# Patient Record
Sex: Male | Born: 1940 | Race: White | Hispanic: No | Marital: Married | Smoking: Never smoker
Health system: Southern US, Community
[De-identification: ages and names within clinical notes are randomized; demographics above are authoritative.]

## PROBLEM LIST (undated history)

## (undated) DIAGNOSIS — R479 Unspecified speech disturbances: Secondary | ICD-10-CM

## (undated) DIAGNOSIS — F0280 Dementia in other diseases classified elsewhere without behavioral disturbance: Secondary | ICD-10-CM

## (undated) DIAGNOSIS — R269 Unspecified abnormalities of gait and mobility: Secondary | ICD-10-CM

## (undated) DIAGNOSIS — E119 Type 2 diabetes mellitus without complications: Secondary | ICD-10-CM

## (undated) DIAGNOSIS — F419 Anxiety disorder, unspecified: Secondary | ICD-10-CM

## (undated) DIAGNOSIS — F32A Depression, unspecified: Secondary | ICD-10-CM

## (undated) DIAGNOSIS — E785 Hyperlipidemia, unspecified: Secondary | ICD-10-CM

## (undated) DIAGNOSIS — G3183 Dementia with Lewy bodies: Secondary | ICD-10-CM

## (undated) DIAGNOSIS — I10 Essential (primary) hypertension: Secondary | ICD-10-CM

## (undated) DIAGNOSIS — F329 Major depressive disorder, single episode, unspecified: Secondary | ICD-10-CM

## (undated) HISTORY — DX: Anxiety disorder, unspecified: F41.9

## (undated) HISTORY — DX: Depression, unspecified: F32.A

## (undated) HISTORY — DX: Hyperlipidemia, unspecified: E78.5

## (undated) HISTORY — DX: Essential (primary) hypertension: I10

## (undated) HISTORY — DX: Type 2 diabetes mellitus without complications: E11.9

## (undated) HISTORY — DX: Unspecified speech disturbances: R47.9

## (undated) HISTORY — DX: Major depressive disorder, single episode, unspecified: F32.9

## (undated) HISTORY — DX: Unspecified abnormalities of gait and mobility: R26.9

## (undated) HISTORY — PX: OTHER SURGICAL HISTORY: SHX169

---

## 2001-11-01 ENCOUNTER — Encounter: Admission: RE | Admit: 2001-11-01 | Discharge: 2001-11-01 | Payer: Self-pay | Admitting: Family Medicine

## 2001-11-01 ENCOUNTER — Encounter: Payer: Self-pay | Admitting: Family Medicine

## 2001-12-31 ENCOUNTER — Encounter: Payer: Self-pay | Admitting: *Deleted

## 2001-12-31 ENCOUNTER — Inpatient Hospital Stay (HOSPITAL_COMMUNITY): Admission: AD | Admit: 2001-12-31 | Discharge: 2002-01-05 | Payer: Self-pay | Admitting: *Deleted

## 2002-05-12 ENCOUNTER — Ambulatory Visit (HOSPITAL_COMMUNITY): Admission: RE | Admit: 2002-05-12 | Discharge: 2002-05-12 | Payer: Self-pay | Admitting: *Deleted

## 2003-04-08 ENCOUNTER — Emergency Department (HOSPITAL_COMMUNITY): Admission: EM | Admit: 2003-04-08 | Discharge: 2003-04-08 | Payer: Self-pay | Admitting: Emergency Medicine

## 2003-04-08 ENCOUNTER — Encounter: Payer: Self-pay | Admitting: Emergency Medicine

## 2003-08-09 ENCOUNTER — Ambulatory Visit (HOSPITAL_COMMUNITY): Admission: RE | Admit: 2003-08-09 | Discharge: 2003-08-09 | Payer: Self-pay | Admitting: *Deleted

## 2006-12-08 ENCOUNTER — Encounter: Admission: RE | Admit: 2006-12-08 | Discharge: 2006-12-08 | Payer: Self-pay | Admitting: Family Medicine

## 2006-12-27 ENCOUNTER — Emergency Department (HOSPITAL_COMMUNITY): Admission: EM | Admit: 2006-12-27 | Discharge: 2006-12-27 | Payer: Self-pay | Admitting: Emergency Medicine

## 2011-08-08 ENCOUNTER — Other Ambulatory Visit: Payer: Self-pay | Admitting: Neurology

## 2011-08-08 DIAGNOSIS — R269 Unspecified abnormalities of gait and mobility: Secondary | ICD-10-CM

## 2011-08-08 DIAGNOSIS — G609 Hereditary and idiopathic neuropathy, unspecified: Secondary | ICD-10-CM

## 2011-08-15 ENCOUNTER — Ambulatory Visit
Admission: RE | Admit: 2011-08-15 | Discharge: 2011-08-15 | Disposition: A | Discharge status: Home / Self Care | Payer: Medicare Other | Source: Ambulatory Visit | Attending: Neurology | Admitting: Neurology

## 2011-08-15 DIAGNOSIS — G609 Hereditary and idiopathic neuropathy, unspecified: Secondary | ICD-10-CM

## 2011-08-15 DIAGNOSIS — R269 Unspecified abnormalities of gait and mobility: Secondary | ICD-10-CM

## 2011-12-30 ENCOUNTER — Ambulatory Visit: Payer: Medicare Other

## 2012-01-06 ENCOUNTER — Ambulatory Visit: Payer: Medicare Other | Attending: Family Medicine

## 2012-01-06 DIAGNOSIS — R262 Difficulty in walking, not elsewhere classified: Secondary | ICD-10-CM | POA: Insufficient documentation

## 2012-01-06 DIAGNOSIS — IMO0001 Reserved for inherently not codable concepts without codable children: Secondary | ICD-10-CM | POA: Insufficient documentation

## 2012-01-06 DIAGNOSIS — M6281 Muscle weakness (generalized): Secondary | ICD-10-CM | POA: Insufficient documentation

## 2012-01-06 DIAGNOSIS — R269 Unspecified abnormalities of gait and mobility: Secondary | ICD-10-CM | POA: Insufficient documentation

## 2012-01-13 ENCOUNTER — Ambulatory Visit: Payer: Medicare Other | Admitting: Physical Therapy

## 2012-01-22 ENCOUNTER — Ambulatory Visit: Payer: Medicare Other

## 2012-01-27 ENCOUNTER — Ambulatory Visit: Payer: Medicare Other

## 2012-02-03 ENCOUNTER — Ambulatory Visit: Payer: Medicare Other | Attending: Family Medicine | Admitting: Physical Therapy

## 2012-02-03 DIAGNOSIS — IMO0001 Reserved for inherently not codable concepts without codable children: Secondary | ICD-10-CM | POA: Insufficient documentation

## 2012-02-03 DIAGNOSIS — R269 Unspecified abnormalities of gait and mobility: Secondary | ICD-10-CM | POA: Insufficient documentation

## 2012-02-03 DIAGNOSIS — R262 Difficulty in walking, not elsewhere classified: Secondary | ICD-10-CM | POA: Insufficient documentation

## 2012-02-03 DIAGNOSIS — M6281 Muscle weakness (generalized): Secondary | ICD-10-CM | POA: Insufficient documentation

## 2013-08-09 ENCOUNTER — Other Ambulatory Visit (HOSPITAL_COMMUNITY): Payer: Self-pay | Admitting: Family Medicine

## 2013-08-09 ENCOUNTER — Encounter (HOSPITAL_COMMUNITY): Payer: Self-pay | Admitting: Family Medicine

## 2013-08-09 ENCOUNTER — Other Ambulatory Visit (HOSPITAL_COMMUNITY): Payer: Medicare Other

## 2013-08-09 DIAGNOSIS — I359 Nonrheumatic aortic valve disorder, unspecified: Secondary | ICD-10-CM

## 2013-08-12 ENCOUNTER — Encounter (HOSPITAL_COMMUNITY): Payer: Self-pay | Admitting: Family Medicine

## 2013-08-30 ENCOUNTER — Ambulatory Visit (HOSPITAL_COMMUNITY): Payer: Medicare Other | Attending: Cardiology | Admitting: Cardiology

## 2013-08-30 ENCOUNTER — Encounter: Payer: Self-pay | Admitting: Cardiology

## 2013-08-30 ENCOUNTER — Encounter (INDEPENDENT_AMBULATORY_CARE_PROVIDER_SITE_OTHER): Payer: Self-pay

## 2013-08-30 DIAGNOSIS — I059 Rheumatic mitral valve disease, unspecified: Secondary | ICD-10-CM | POA: Insufficient documentation

## 2013-08-30 DIAGNOSIS — I359 Nonrheumatic aortic valve disorder, unspecified: Secondary | ICD-10-CM

## 2013-08-30 NOTE — Progress Notes (Signed)
Echo performed. 

## 2013-10-15 ENCOUNTER — Ambulatory Visit (INDEPENDENT_AMBULATORY_CARE_PROVIDER_SITE_OTHER): Payer: Medicare Other | Admitting: Family Medicine

## 2013-10-15 VITALS — BP 134/80 | HR 76 | Temp 98.1°F | Resp 18 | Ht 68.0 in | Wt 174.8 lb

## 2013-10-15 DIAGNOSIS — R05 Cough: Secondary | ICD-10-CM

## 2013-10-15 DIAGNOSIS — R059 Cough, unspecified: Secondary | ICD-10-CM

## 2013-10-15 MED ORDER — BENZONATATE 100 MG PO CAPS: 100.0000 mg | ORAL_CAPSULE | Freq: Three times a day (TID) | ORAL | Status: DC | PRN

## 2013-10-15 MED ORDER — DOXYCYCLINE HYCLATE 100 MG PO TABS: 100.0000 mg | ORAL_TABLET | Freq: Two times a day (BID) | ORAL | Status: DC

## 2013-10-15 NOTE — Progress Notes (Signed)
Subjective:    Patient ID: Raymond Ferrell, male    DOB: [birth year], 73 y.o.   MRN: [id number]  HPI This chart was scribed for Meredith Staggers, MD by Andrew Au, ED Scribe. This patient was seen in room 14 and the patient's care was started at 10:13 AM.  HPI Comments: Raymond Ferrell is a 73 y.o. male who presents to the [facility] complaining of unproductive dry cough and nasal congestion onset one week. Pt states his symptoms started in his chest. He reports that he has only had congestion for about 2 day.  He has been taking Mucinex with minor relief. Pt reports he is not a smoker but occasionally drinks. Pt reports recent sick contacts. He denies fever. He also denies asthma or h/o heart surgery. Pt reports that he has gotten a flu shot this year.  Pt has been seeing Dr. Massie Maroon at [facility].   echo 08/30/13 - EF 55-60% and mild thickening and calcification of his aortic valve, no stenosis.   There are no active problems to display for this patient.  Past Medical History  Diagnosis Date  . Depression   . Diabetes mellitus without complication   . Anxiety   . Hypertension   . Hyperlipidemia    History reviewed. No pertinent past surgical history. No Known Allergies Prior to Admission medications   Medication Sig Start Date End Date Taking? Authorizing Provider  amLODipine (NORVASC) 5 MG tablet Take 5 mg by mouth daily.   Yes Historical Provider, MD  atorvastatin (LIPITOR) 10 MG tablet Take 10 mg by mouth daily.   Yes Historical Provider, MD  FLUoxetine (PROZAC) 20 MG tablet Take 20 mg by mouth daily.   Yes Historical Provider, MD  labetalol (NORMODYNE) 200 MG tablet Take 200 mg by mouth 2 (two) times daily.   Yes Historical Provider, MD  lisinopril (PRINIVIL,ZESTRIL) 20 MG tablet Take 20 mg by mouth daily.   Yes Historical Provider, MD  lisinopril-hydrochlorothiazide (PRINZIDE,ZESTORETIC) 20-25 MG per tablet Take 1 tablet by mouth daily.   Yes Historical  Provider, MD  metFORMIN (GLUCOPHAGE) 500 MG tablet Take by mouth 2 (two) times daily with a meal.   Yes Historical Provider, MD   History   Social History  . Marital Status: Married    Spouse Name: N/A    Number of Children: N/A  . Years of Education: N/A   Occupational History  . Not on file.   Social History Main Topics  . Smoking status: Never Smoker   . Smokeless tobacco: Not on file  . Alcohol Use: Yes  . Drug Use: Not on file  . Sexual Activity: Not on file   Other Topics Concern  . Not on file   Social History Narrative  . No narrative on file     Review of Systems  Constitutional: Negative for fever and chills.  HENT: Positive for congestion.   Respiratory: Positive for cough. Negative for shortness of breath.        Objective:   Physical Exam  Vitals reviewed. Constitutional: He is oriented to person, place, and time. He appears well-developed and well-nourished.  HENT:  Head: Normocephalic and atraumatic.  Right Ear: Tympanic membrane, external ear and ear canal normal.  Left Ear: Tympanic membrane, external ear and ear canal normal.  Nose: No rhinorrhea.  Mouth/Throat: Oropharynx is clear and moist and mucous membranes are normal. No oropharyngeal exudate or posterior oropharyngeal erythema.  Eyes: Conjunctivae are normal. Pupils are  equal, round, and reactive to light.  Neck: Neck supple.  Cardiovascular: Normal rate, regular rhythm and intact distal pulses.   Murmur heard.  Systolic murmur is present with a grade of 2/6  2/6 murmur in left chest wall, left sternal bordel   Pulmonary/Chest: Effort normal and breath sounds normal. No respiratory distress. He has no wheezes. He has no rhonchi. He has no rales.  Abdominal: Soft. There is no tenderness.  Lymphadenopathy:    He has no cervical adenopathy.  Neurological: He is alert and oriented to person, place, and time.  Skin: Skin is warm and dry. No rash noted.  Psychiatric: He has a normal mood  and affect. His behavior is normal.   Filed Vitals:   10/15/13 0928  BP: 134/80  Pulse: 76  Temp: 98.1 F (36.7 C)  TempSrc: Oral  Resp: 18  Height: 5\' 8"  (1.727 m)  Weight: 174 lb 12.8 oz (79.289 kg)  SpO2: 98%      Assessment & Plan:   Raymond Ferrell is a 73 y.o. male Cough - Plan: benzonatate (TESSALON) 100 MG capsule, doxycycline (VIBRA-TABS) 100 MG tablet  1 week hx of cough, suspected viral illness, reassuring exam and vitals signs stable, heart murmur, but recent echo noted and told he has had this a long time. No rales on exam. Cont mucinex, sx care, tessalon prn, and if not improving in next few days, can start doxycycline, but discussed with pt and dtr rtc precautions as below. Understanding expressed.    Meds ordered this encounter  Medications  . amLODipine (NORVASC) 5 MG tablet    Sig: Take 5 mg by mouth daily.  Marland Kitchen. labetalol (NORMODYNE) 200 MG tablet    Sig: Take 200 mg by mouth 2 (two) times daily.  . metFORMIN (GLUCOPHAGE) 500 MG tablet    Sig: Take by mouth 2 (two) times daily with a meal.  . FLUoxetine (PROZAC) 20 MG tablet    Sig: Take 20 mg by mouth daily.  Marland Kitchen. lisinopril (PRINIVIL,ZESTRIL) 20 MG tablet    Sig: Take 20 mg by mouth daily.  Marland Kitchen. lisinopril-hydrochlorothiazide (PRINZIDE,ZESTORETIC) 20-25 MG per tablet    Sig: Take 1 tablet by mouth daily.  Marland Kitchen. atorvastatin (LIPITOR) 10 MG tablet    Sig: Take 10 mg by mouth daily.  . benzonatate (TESSALON) 100 MG capsule    Sig: Take 1 capsule (100 mg total) by mouth 3 (three) times daily as needed for cough.    Dispense:  20 capsule    Refill:  0  . doxycycline (VIBRA-TABS) 100 MG tablet    Sig: Take 1 tablet (100 mg total) by mouth 2 (two) times daily.    Dispense:  20 tablet    Refill:  0   Patient Instructions  Your lungs sound clear this morning. I do not hear a pneumonia, and your temperature and oxygen tests are normal.  I suspect you had a virus, and cough can last a week or two with this. Ok to  continue mucinex for cough, drink plenty of fluids. I prescribed a medicine called tessalon if you need something to stop the cough - especially to sleep. If not improving next week - can fill antibiotic for possible bronchitis, but return to the clinic or go to the nearest emergency room if any of your symptoms worsen or new symptoms occur - especially if any shortness of breath, fever, or other worsening.   Cough, Adult  A cough is a reflex that helps clear your  throat and airways. It can help heal the body or may be a reaction to an irritated airway. A cough may only last 2 or 3 weeks (acute) or may last more than 8 weeks (chronic).  CAUSES Acute cough:  Viral or bacterial infections. Chronic cough:  Infections.  Allergies.  Asthma.  Post-nasal drip.  Smoking.  Heartburn or acid reflux.  Some medicines.  Chronic lung problems (COPD).  Cancer. SYMPTOMS   Cough.  Fever.  Chest pain.  Increased breathing rate.  High-pitched whistling sound when breathing (wheezing).  Colored mucus that you cough up (sputum). TREATMENT   A bacterial cough may be treated with antibiotic medicine.  A viral cough must run its course and will not respond to antibiotics.  Your caregiver may recommend other treatments if you have a chronic cough. HOME CARE INSTRUCTIONS   Only take over-the-counter or prescription medicines for pain, discomfort, or fever as directed by your caregiver. Use cough suppressants only as directed by your caregiver.  Use a cold steam vaporizer or humidifier in your bedroom or home to help loosen secretions.  Sleep in a semi-upright position if your cough is worse at night.  Rest as needed.  Stop smoking if you smoke. SEEK IMMEDIATE MEDICAL CARE IF:   You have pus in your sputum.  Your cough starts to worsen.  You cannot control your cough with suppressants and are losing sleep.  You begin coughing up blood.  You have difficulty breathing.  You  develop pain which is getting worse or is uncontrolled with medicine.  You have a fever. MAKE SURE YOU:   Understand these instructions.  Will watch your condition.  Will get help right away if you are not doing well or get worse. Document Released: 03/14/2011 Document Revised: 12/08/2011 Document Reviewed: 03/14/2011 [facility] Patient Information 2014 [place], [place].      I personally performed the services described in this documentation, which was scribed in my presence. The recorded information has been reviewed and considered, and addended by me as needed.

## 2013-10-15 NOTE — Patient Instructions (Signed)
Your lungs sound clear this morning. I do not hear a pneumonia, and your temperature and oxygen tests are normal.  I suspect you had a virus, and cough can last a week or two with this. Ok to continue mucinex for cough, drink plenty of fluids. I prescribed a medicine called tessalon if you need something to stop the cough - especially to sleep. If not improving next week - can fill antibiotic for possible bronchitis, but return to the clinic or go to the nearest emergency room if any of your symptoms worsen or new symptoms occur - especially if any shortness of breath, fever, or other worsening.   Cough, Adult  A cough is a reflex that helps clear your throat and airways. It can help heal the body or may be a reaction to an irritated airway. A cough may only last 2 or 3 weeks (acute) or may last more than 8 weeks (chronic).  CAUSES Acute cough:  Viral or bacterial infections. Chronic cough:  Infections.  Allergies.  Asthma.  Post-nasal drip.  Smoking.  Heartburn or acid reflux.  Some medicines.  Chronic lung problems (COPD).  Cancer. SYMPTOMS   Cough.  Fever.  Chest pain.  Increased breathing rate.  High-pitched whistling sound when breathing (wheezing).  Colored mucus that you cough up (sputum). TREATMENT   A bacterial cough may be treated with antibiotic medicine.  A viral cough must run its course and will not respond to antibiotics.  Your caregiver may recommend other treatments if you have a chronic cough. HOME CARE INSTRUCTIONS   Only take over-the-counter or prescription medicines for pain, discomfort, or fever as directed by your caregiver. Use cough suppressants only as directed by your caregiver.  Use a cold steam vaporizer or humidifier in your bedroom or home to help loosen secretions.  Sleep in a semi-upright position if your cough is worse at night.  Rest as needed.  Stop smoking if you smoke. SEEK IMMEDIATE MEDICAL CARE IF:   You have pus in  your sputum.  Your cough starts to worsen.  You cannot control your cough with suppressants and are losing sleep.  You begin coughing up blood.  You have difficulty breathing.  You develop pain which is getting worse or is uncontrolled with medicine.  You have a fever. MAKE SURE YOU:   Understand these instructions.  Will watch your condition.  Will get help right away if you are not doing well or get worse. Document Released: 03/14/2011 Document Revised: 12/08/2011 Document Reviewed: 03/14/2011 [facility] Patient Information 2014 [place], [place].

## 2014-06-28 ENCOUNTER — Other Ambulatory Visit: Payer: Self-pay | Admitting: Family Medicine

## 2014-06-28 DIAGNOSIS — R269 Unspecified abnormalities of gait and mobility: Secondary | ICD-10-CM

## 2014-07-07 ENCOUNTER — Ambulatory Visit
Admission: RE | Admit: 2014-07-07 | Discharge: 2014-07-07 | Disposition: A | Discharge status: Home / Self Care | Payer: Medicare Other | Source: Ambulatory Visit | Attending: Family Medicine | Admitting: Family Medicine

## 2014-07-07 DIAGNOSIS — R269 Unspecified abnormalities of gait and mobility: Secondary | ICD-10-CM

## 2014-08-10 ENCOUNTER — Encounter: Payer: Self-pay | Admitting: Neurology

## 2014-08-10 ENCOUNTER — Ambulatory Visit (INDEPENDENT_AMBULATORY_CARE_PROVIDER_SITE_OTHER): Payer: Medicare Other | Admitting: Neurology

## 2014-08-10 ENCOUNTER — Encounter (INDEPENDENT_AMBULATORY_CARE_PROVIDER_SITE_OTHER): Payer: Self-pay

## 2014-08-10 VITALS — BP 132/75 | HR 89 | Ht 67.75 in | Wt 180.0 lb

## 2014-08-10 DIAGNOSIS — R413 Other amnesia: Secondary | ICD-10-CM

## 2014-08-10 DIAGNOSIS — R531 Weakness: Secondary | ICD-10-CM

## 2014-08-10 DIAGNOSIS — E538 Deficiency of other specified B group vitamins: Secondary | ICD-10-CM | POA: Insufficient documentation

## 2014-08-10 DIAGNOSIS — R269 Unspecified abnormalities of gait and mobility: Secondary | ICD-10-CM

## 2014-08-10 DIAGNOSIS — R4789 Other speech disturbances: Secondary | ICD-10-CM

## 2014-08-10 DIAGNOSIS — R479 Unspecified speech disturbances: Secondary | ICD-10-CM | POA: Insufficient documentation

## 2014-08-10 MED ORDER — CARBIDOPA-LEVODOPA 25-100 MG PO TABS: 1.0000 | ORAL_TABLET | Freq: Three times a day (TID) | ORAL | Status: DC

## 2014-08-10 NOTE — Progress Notes (Signed)
PATIENT: Raymond Ferrell W Blanck DOB: Nov 25, 1940  HISTORICAL  Eston Irven EasterlyW Macpherson is of 73 years old right-handed Caucasian male, referred by his primary care physician Dr. Peterson AoKoriala, accompanied by his  Son in law Amalia HaileyBruce Martin for evaluation of worsening gait difficulty, generalized weakness.  He had a past medical history of hypertension,hyperlipidemia, I have seen him previously in 2013 for evaluation of lightheadedness sensation when getting up quickly, suggestive of orthostatic symptoms, he was going through a lot of stress, separated from his wife,he also complains of depression, anxiety symptoms,  At that time, we had extensive evaluations,  MRI brain (without contrast) demonstrating moderate chronic small vessel ischemic disease. MRI thoracic spine (without contrast) demonstrating: At T11-12 there is mild disc bulging with no spinal stenosis or foraminal narrowing.  no canal stenosis.  MRI cervical spine, multilevel degenerative disc disease, but no significant foraminal or canal stenosis. EMG/NCS, no evidence of large fiber peripheral neuropathy.    Over the past 2 years, he continued to to decline slowly over time, he is much less active, lives in an apartment by himself, still driving, only short distance, could not find his way around into places, getting slower, bilateral lower extremity weakness, gait difficulty,he denies significant pain, no loss sensory of smell, no REM sleep disorder, he does has mild memory trouble. He had a chronic stuttering,Sometimes worse than the others  MRI lumbar in 2015 showed mildly increased chronic disc and endplate degeneration at L4-L5 resulting in mild right lateral recess stenosis. Moderate right greater than left L4 neural foraminal stenosis at this level appears chronic and stable. Otherwise stable lumbar spine compared to 2008, No lumbar spinal stenosis. No findings identified to explain abnormal gait.  He retired from [employer], Energy managerdriving  forklift,  REVIEW OF SYSTEMS: Full 14 system review of systems performed and notable only for fatigue, walking difficulty  ALLERGIES: No Known Allergies  HOME MEDICATIONS: Current Outpatient Prescriptions on File Prior to Visit  Medication Sig Dispense Refill  . amLODipine (NORVASC) 5 MG tablet Take 5 mg by mouth daily.    Marland Kitchen. atorvastatin (LIPITOR) 10 MG tablet Take 10 mg by mouth daily.    . benzonatate (TESSALON) 100 MG capsule Take 1 capsule (100 mg total) by mouth 3 (three) times daily as needed for cough. 20 capsule 0  . doxycycline (VIBRA-TABS) 100 MG tablet Take 1 tablet (100 mg total) by mouth 2 (two) times daily. 20 tablet 0  . FLUoxetine (PROZAC) 20 MG tablet Take 20 mg by mouth daily.    Marland Kitchen. labetalol (NORMODYNE) 200 MG tablet Take 200 mg by mouth 2 (two) times daily.    Marland Kitchen. lisinopril (PRINIVIL,ZESTRIL) 20 MG tablet Take 20 mg by mouth daily.    Marland Kitchen. lisinopril-hydrochlorothiazide (PRINZIDE,ZESTORETIC) 20-25 MG per tablet Take 1 tablet by mouth daily.    . metFORMIN (GLUCOPHAGE) 500 MG tablet Take by mouth 2 (two) times daily with a meal.     No current facility-administered medications on file prior to visit.    PAST MEDICAL HISTORY: Past Medical History  Diagnosis Date  . Depression   . Diabetes mellitus without complication   . Anxiety   . Hypertension   . Hyperlipidemia   . Gait abnormality   . Speech problem     PAST SURGICAL HISTORY: Past Surgical History  Procedure Laterality Date  . None      FAMILY HISTORY: Family History  Problem Relation Age of Onset  . Cancer Mother   . Cancer Sister  SOCIAL HISTORY:  History   Social History  . Marital Status: Married    Spouse Name: N/A    Number of Children: 2  . Years of Education: 12 th   Occupational History  .      retired   Social History Main Topics  . Smoking status: Never Smoker   . Smokeless tobacco: Never Used  . Alcohol Use: No  . Drug Use: No  . Sexual Activity: Not on file   Other  Topics Concern  . Not on file   Social History Narrative   Patient Lives at home alone .   Retried  [employer].   Education 12 th grade.   Right handed.   Caffeine one cup of coffee daily.           PHYSICAL EXAM   Filed Vitals:   08/10/14 1543  BP: 132/75  Pulse: 89  Height: 5' 7.75" (1.721 m)  Weight: 180 lb (81.647 kg)    Not recorded      Body mass index is 27.57 kg/(m^2).   Generalized: In no acute distress  Neck: Supple, no carotid bruits   Cardiac: Regular rate rhythm  Pulmonary: Clear to auscultation bilaterally  Musculoskeletal: No deformity  Neurological examination  Mentation: decreased facial expression, stuttering of speech, Mini-Mental Status Examination 26 out of 30, missed 2 out of 3 recalls, has difficulty coping designs, or writing sentence,  Cranial nerve II-XII: Pupils were equal round reactive to light. Extraocular movements were full.  Visual field were full on confrontational test. Bilateral fundi were sharp.  Facial sensation and strength were normal. Hearing was intact to finger rubbing bilaterally. Uvula tongue midline.  Head turning and shoulder shrug and were normal and symmetric.Tongue protrusion into cheek strength was normal.  Motor: he has no resting tremor,no significant weakness, mild to moderate rigidity of limb, and nuchal muscles, left worse than right, increased with reinforcement maneuver He also has rigidity, bradykinesia on rapid alternating movement  Sensory: Intact to fine touch, pinprick, preserved vibratory sensation, and proprioception at toes.  Coordination: Normal finger to nose, heel-to-shin bilaterally there was no truncal ataxia  Gait: getting up from seated position,by pushing on chair arm, cautious, wide-based, unsteady gait, decreased arm swing, En block turning  Deep tendon reflexes: Brachioradialis 2/2, biceps 2/2, triceps 2/2, patellar 2/2, Achilles 2/2, plantar responses were flexor  bilaterally.   DIAGNOSTIC DATA (LABS, IMAGING, TESTING) - I reviewed patient records, labs, notes, testing and imaging myself where available.   ASSESSMENT AND PLAN  Rodolph Irven EasterlyW Yawn is a 73 y.o. male presenting with gradual onset memory trouble,gait difficulty,, bradykinesia, rigidity on examination,With mild parkinsonian features, Mini-Mental Status Examination 26 out of 30, previous history of orthostatic symptoms,  1, Most suggestive for central nervous system degenerative disorder, Parkinson's disease, versus Lewy body dementia, 2, Sinemet 25/100 mg 1 tablets 3 times a day 3, laboratory evaluation from primary care physician 4, return to clinic in 1 month   Levert FeinsteinYijun Petros Ahart, M.D. Ph.D.  [facility] [address], [address] [place], [place] [address] [phone]

## 2014-09-18 ENCOUNTER — Encounter: Payer: Self-pay | Admitting: Neurology

## 2014-09-18 ENCOUNTER — Ambulatory Visit (INDEPENDENT_AMBULATORY_CARE_PROVIDER_SITE_OTHER): Payer: Medicare Other | Admitting: Neurology

## 2014-09-18 VITALS — BP 156/78 | HR 70 | Ht 70.0 in | Wt 185.5 lb

## 2014-09-18 DIAGNOSIS — R269 Unspecified abnormalities of gait and mobility: Secondary | ICD-10-CM

## 2014-09-18 DIAGNOSIS — R479 Unspecified speech disturbances: Secondary | ICD-10-CM

## 2014-09-18 DIAGNOSIS — R4789 Other speech disturbances: Secondary | ICD-10-CM

## 2014-09-18 MED ORDER — CARBIDOPA-LEVODOPA 25-100 MG PO TABS: 2.0000 | ORAL_TABLET | Freq: Three times a day (TID) | ORAL | Status: DC

## 2014-09-18 NOTE — Progress Notes (Signed)
PATIENT: Raymond Ferrell DOB: 12-Apr-1941  HISTORICAL  Raymond Ferrell is of 73 years old right-handed Caucasian male, referred by his primary care physician Dr. Peterson AoKoriala, accompanied by his  Son in law Raymond Ferrell for evaluation of worsening gait difficulty, generalized weakness.  He had a past medical history of hypertension,hyperlipidemia, I have seen him previously in 2013 for evaluation of lightheadedness sensation when getting up quickly, suggestive of orthostatic symptoms, he was going through a lot of stress, separated from his wife,he also complains of depression, anxiety symptoms,  At that time, we had extensive evaluations,  MRI brain (without contrast) demonstrating moderate chronic small vessel ischemic disease. MRI thoracic spine (without contrast) demonstrating: At T11-12 there is mild disc bulging with no spinal stenosis or foraminal narrowing.  no canal stenosis.  MRI cervical spine, multilevel degenerative disc disease, but no significant foraminal or canal stenosis. EMG/NCS, no evidence of large fiber peripheral neuropathy.    Over the past 2 years, he continued to to decline slowly over time, he is much less active, lives in an apartment by himself, still driving, only short distance, could not find his way around into places, getting slower, bilateral lower extremity weakness, gait difficulty,he denies significant pain, no loss sensory of smell, no REM sleep disorder, he does has mild memory trouble. He had a chronic stuttering,Sometimes worse than the others  MRI lumbar in 2015 showed mildly increased chronic disc and endplate degeneration at L4-L5 resulting in mild right lateral recess stenosis. Moderate right greater than left L4 neural foraminal stenosis at this level appears chronic and stable. Otherwise stable lumbar spine compared to 2008, No lumbar spinal stenosis. No findings identified to explain abnormal gait.  He retired from [employer], Energy managerdriving  forklift,  UPDATE Sep 18 2014: I started him on Sinemet 25/100 mg one tablet 3 times a day since November 2015, he has no significant side effect, his gait has slight improvement, He denies significant pain  REVIEW OF SYSTEMS: Full 14 system review of systems performed and notable only for fatigue, walking difficulty  ALLERGIES: No Known Allergies  HOME MEDICATIONS: Current Outpatient Prescriptions on File Prior to Visit  Medication Sig Dispense Refill  . amLODipine (NORVASC) 5 MG tablet Take 5 mg by mouth daily.    Marland Kitchen. atorvastatin (LIPITOR) 10 MG tablet Take 10 mg by mouth daily.    . benzonatate (TESSALON) 100 MG capsule Take 1 capsule (100 mg total) by mouth 3 (three) times daily as needed for cough. 20 capsule 0  . carbidopa-levodopa (SINEMET) 25-100 MG per tablet Take 1 tablet by mouth 3 (three) times daily. 90 tablet 11  . doxycycline (VIBRA-TABS) 100 MG tablet Take 1 tablet (100 mg total) by mouth 2 (two) times daily. 20 tablet 0  . FLUoxetine (PROZAC) 20 MG tablet Take 20 mg by mouth daily.    Marland Kitchen. labetalol (NORMODYNE) 200 MG tablet Take 200 mg by mouth 2 (two) times daily.    Marland Kitchen. lisinopril (PRINIVIL,ZESTRIL) 20 MG tablet Take 20 mg by mouth daily.    Marland Kitchen. lisinopril-hydrochlorothiazide (PRINZIDE,ZESTORETIC) 20-25 MG per tablet Take 1 tablet by mouth daily.    . metFORMIN (GLUCOPHAGE) 500 MG tablet Take by mouth 2 (two) times daily with a meal.    . metFORMIN (GLUCOPHAGE-XR) 500 MG 24 hr tablet Take 500 tablets by mouth daily.  3   No current facility-administered medications on file prior to visit.    PAST MEDICAL HISTORY: Past Medical History  Diagnosis Date  . Depression   .  Diabetes mellitus without complication   . Anxiety   . Hypertension   . Hyperlipidemia   . Gait abnormality   . Speech problem     PAST SURGICAL HISTORY: Past Surgical History  Procedure Laterality Date  . None      FAMILY HISTORY: Family History  Problem Relation Age of Onset  . Cancer  Mother   . Cancer Sister     SOCIAL HISTORY:  History   Social History  . Marital Status: Married    Spouse Name: N/A    Number of Children: 2  . Years of Education: 12 th   Occupational History  .      retired   Social History Main Topics  . Smoking status: Never Smoker   . Smokeless tobacco: Never Used  . Alcohol Use: No  . Drug Use: No  . Sexual Activity: Not on file   Other Topics Concern  . Not on file   Social History Narrative   Patient Lives at home alone .   Retried  Corporate [employer].   Education 12 th grade.   Right handed.   Caffeine one cup of coffee daily.           PHYSICAL EXAM   There were no vitals filed for this visit.  Not recorded      There is no weight on file to calculate BMI.   Generalized: In no acute distress  Neck: Supple, no carotid bruits   Cardiac: Regular rate rhythm  Pulmonary: Clear to auscultation bilaterally  Musculoskeletal: No deformity  Neurological examination  Mentation: decreased facial expression, stuttering of speech, Mini-Mental Status Examination 26 out of 30, missed 2 out of 3 recalls, has difficulty coping designs, or writing sentence,  Cranial nerve II-XII: Pupils were equal round reactive to light. Extraocular movements were full.  Visual field were full on confrontational test. Bilateral fundi were sharp.  Facial sensation and strength were normal. Hearing was intact to finger rubbing bilaterally. Uvula tongue midline.  Head turning and shoulder shrug and were normal and symmetric.Tongue protrusion into cheek strength was normal.  Motor: he has no resting tremor,no significant weakness, mild to moderate rigidity of limb, and nuchal muscles, left worse than right, increased with reinforcement maneuver He also has rigidity, bradykinesia on rapid alternating movement  Sensory: Intact to fine touch, pinprick, preserved vibratory sensation, and proprioception at toes.  Coordination: Normal finger to nose,  heel-to-shin bilaterally there was no truncal ataxia  Gait: getting up from seated position,by pushing on chair arm, cautious, wide-based, unsteady gait, decreased arm swing, En block turning  Deep tendon reflexes: Brachioradialis 2/2, biceps 2/2, triceps 2/2, patellar 2/2, Achilles 2/2, plantar responses were flexor bilaterally.   DIAGNOSTIC DATA (LABS, IMAGING, TESTING) - I reviewed patient records, labs, notes, testing and imaging myself where available.   ASSESSMENT AND PLAN  Raymond Ferrell is a 73 y.o. male presenting with gradual onset memory trouble,gait difficulty,, bradykinesia, rigidity on examination,With mild parkinsonian features, Mini-Mental Status Examination 26 out of 30, previous history of orthostatic symptoms,  1, Most suggestive for central nervous system degenerative disorder, Parkinson's disease, versus Lewy body dementia, 2, Increase Sinemet 25/100 mg 2 tablets 3 times a day 3,   return to clinic in 3 month   Levert FeinsteinYijun Kizzie Cotten, M.D. Ph.D.  [facility] [address], [address] [place], [place] [address] [phone]

## 2014-12-18 ENCOUNTER — Ambulatory Visit (INDEPENDENT_AMBULATORY_CARE_PROVIDER_SITE_OTHER): Payer: Medicare Other | Admitting: Neurology

## 2014-12-18 ENCOUNTER — Encounter: Payer: Self-pay | Admitting: Neurology

## 2014-12-18 VITALS — BP 162/82 | HR 66 | Ht 70.0 in | Wt 182.0 lb

## 2014-12-18 DIAGNOSIS — R5382 Chronic fatigue, unspecified: Secondary | ICD-10-CM

## 2014-12-18 DIAGNOSIS — G4710 Hypersomnia, unspecified: Secondary | ICD-10-CM | POA: Diagnosis not present

## 2014-12-18 DIAGNOSIS — R269 Unspecified abnormalities of gait and mobility: Secondary | ICD-10-CM | POA: Diagnosis not present

## 2014-12-18 DIAGNOSIS — R413 Other amnesia: Secondary | ICD-10-CM | POA: Diagnosis not present

## 2014-12-18 MED ORDER — CARBIDOPA-LEVODOPA 25-100 MG PO TABS: 1.5000 | ORAL_TABLET | Freq: Three times a day (TID) | ORAL | Status: DC

## 2014-12-18 MED ORDER — MEMANTINE HCL 10 MG PO TABS: 10.0000 mg | ORAL_TABLET | Freq: Two times a day (BID) | ORAL | Status: DC

## 2014-12-18 NOTE — Progress Notes (Signed)
PATIENT: Raymond Ferrell DOB: 1941-03-26  HISTORICAL  Raymond Ferrell is of 74 years old right-handed Caucasian male, referred by his primary care physician Dr. Peterson Ao, accompanied by [name] for evaluation of worsening gait difficulty, generalized weakness.  He had a past medical history of hypertension,hyperlipidemia, I have seen him previously in 2013 for evaluation of lightheadedness sensation when getting up quickly, suggestive of orthostatic symptoms, he was going through a lot of stress, separated from his wife,he also complains of depression, anxiety symptoms,  At that time, we had extensive evaluations,  MRI brain (without contrast) demonstrating moderate chronic small vessel ischemic disease. MRI thoracic spine (without contrast) demonstrating: At T11-12 there is mild disc bulging with no spinal stenosis or foraminal narrowing.  no canal stenosis.  MRI cervical spine, multilevel degenerative disc disease, but no significant foraminal or canal stenosis. EMG/NCS, no evidence of large fiber peripheral neuropathy.    Over the past 2 years, he continued to to decline slowly over time, he is much less active, lives in an apartment by himself, still driving, only short distance, could not find his way around into places, getting slower, bilateral lower extremity weakness, gait difficulty,he denies significant pain, no loss sensory of smell, no REM sleep disorder, he does has mild memory trouble. He had a chronic stuttering,Sometimes worse than the others  MRI lumbar in 2015 showed mildly increased chronic disc and endplate degeneration at L4-L5 resulting in mild right lateral recess stenosis. Moderate right greater than left L4 neural foraminal stenosis at this level appears chronic and stable. Otherwise stable lumbar spine compared to 2008, No lumbar spinal stenosis. No findings identified to explain abnormal gait.  He retired from [employer], Engineer, technical sales,  UPDATE  Sep 18 2014: I started him on Sinemet 25/100 mg one tablet 3 times a day since November 2015, he has no significant side effect, his gait has slight improvement,  UPDATE December 18 2014; He is now taking sinemet 25/100 2 tabs tid, he complains of sleepiness, it does help him move a little bit better, but he seems to have more sleepiness after taking medications, he had slow worsening memory trouble, most significant difficulty walking   REVIEW OF SYSTEMS: Full 14 system review of systems performed and notable only for fatigue, walking difficulty  ALLERGIES: No Known Allergies  HOME MEDICATIONS: Current Outpatient Prescriptions on File Prior to Visit  Medication Sig Dispense Refill  . amLODipine (NORVASC) 5 MG tablet Take 5 mg by mouth daily.    Marland Kitchen atorvastatin (LIPITOR) 10 MG tablet Take 10 mg by mouth daily.    . benzonatate (TESSALON) 100 MG capsule Take 1 capsule (100 mg total) by mouth 3 (three) times daily as needed for cough. 20 capsule 0  . carbidopa-levodopa (SINEMET) 25-100 MG per tablet Take 2 tablets by mouth 3 (three) times daily. 180 tablet 11  . doxycycline (VIBRA-TABS) 100 MG tablet Take 1 tablet (100 mg total) by mouth 2 (two) times daily. 20 tablet 0  . labetalol (NORMODYNE) 200 MG tablet Take 200 mg by mouth 2 (two) times daily.    Marland Kitchen lisinopril (PRINIVIL,ZESTRIL) 20 MG tablet Take 20 mg by mouth daily.    Marland Kitchen lisinopril-hydrochlorothiazide (PRINZIDE,ZESTORETIC) 20-25 MG per tablet Take 1 tablet by mouth daily.    . metFORMIN (GLUCOPHAGE) 500 MG tablet Take by mouth 2 (two) times daily with a meal.     No current facility-administered medications on file prior to visit.    PAST MEDICAL  HISTORY: Past Medical History  Diagnosis Date  . Depression   . Diabetes mellitus without complication   . Anxiety   . Hypertension   . Hyperlipidemia   . Gait abnormality   . Speech problem     PAST SURGICAL HISTORY: Past Surgical History  Procedure Laterality Date  . None       FAMILY HISTORY: Family History  Problem Relation Age of Onset  . Cancer Mother   . Cancer Sister     SOCIAL HISTORY:  History   Social History  . Marital Status: Married    Spouse Name: N/A  . Number of Children: 2  . Years of Education: 12 th   Occupational History  .      retired   Social History Main Topics  . Smoking status: Never Smoker   . Smokeless tobacco: Never Used  . Alcohol Use: No  . Drug Use: No  . Sexual Activity: Not on file   Other Topics Concern  . Not on file   Social History Narrative   Patient Lives at home alone .   Retried  Corporate treasurer.   Education 12 th grade.   Right handed.   Caffeine one cup of coffee daily.           PHYSICAL EXAM   Filed Vitals:   12/18/14 1050  BP: 162/82  Pulse: 66  Height:  (1.778 m)  Weight: 182 lb (82.555 kg)    Not recorded      Body mass index is 26.11 kg/(m^2).  PHYSICAL EXAMNIATION:  Gen: NAD, conversant, well nourised, obese, well groomed                     Cardiovascular: Regular rate rhythm, no peripheral edema, warm, nontender. Eyes: Conjunctivae clear without exudates or hemorrhage Neck: Supple, no carotid bruise. Pulmonary: Clear to auscultation bilaterally   NEUROLOGICAL EXAM:  MENTAL STATUS: Speech:    Speech is normal; fluent and spontaneous with normal comprehension.  Cognition: Mini-Mental Status Examination is 20 out of 30, she is not oriented to date, season, missed one out of 3 recalls, has difficulty with repetition, difficulties toward backwards, animal naming 7  CRANIAL NERVES: CN II: Visual fields are full to confrontation. Fundoscopic exam is normal with sharp discs and no vascular changes. Venous pulsations are present bilaterally. Pupils are 4 mm and briskly reactive to light. Visual acuity is 20/20 bilaterally. CN III, IV, VI: extraocular movement are normal. No ptosis. CN V: Facial sensation is intact to pinprick in all 3 divisions bilaterally. Corneal  responses are intact.  CN VII: Face is symmetric with normal eye closure and smile. CN VIII: Hearing is normal to rubbing fingers CN IX, X: Palate elevates symmetrically. Phonation is normal. CN XI: Head turning and shoulder shrug are intact CN XII: Tongue is midline with normal movements and no atrophy.  MOTOR: There is no pronator drift of out-stretched arms. Muscle bulk and tone are normal. Muscle strength is normal.   Shoulder abduction Shoulder external rotation Elbow flexion Elbow extension Wrist flexion Wrist extension Finger abduction Hip flexion Knee flexion Knee extension Ankle dorsi flexion Ankle plantar flexion  R L REFLEXES: Reflexes are 2+ and symmetric at the biceps, triceps, knees, and ankles. Plantar responses are flexor.  SENSORY: Light touch, pinprick, position sense, and  vibration sense are intact in fingers and toes.  COORDINATION: Rapid alternating movements and fine finger movements are intact. There is no dysmetria on finger-to-nose and heel-knee-shin. There are no abnormal or extraneous movements.   GAIT/STANCE: Stooped forward, cautious, decreased arm swing,.  Romberg is absent.   DIAGNOSTIC DATA (LABS, IMAGING, TESTING) - I reviewed patient records, labs, notes, testing and imaging myself where available.   ASSESSMENT AND PLAN  Raymond Ferrell is a 74 y.o. male presenting with gradual onset memory trouble,gait difficulty,, bradykinesia, rigidity on examination,With mild parkinsonian features, Mini-Mental Status Examination 20 out of 30, previous history of orthostatic symptoms,  1, Most suggestive for central nervous system degenerative disorder, Parkinson's disease, versus Lewy body dementia, 2, complains of side effect Sinemet 25/100 mg 2 tablets 3 times a day, will decrease to one and half tablets 3 times a day 3, he has symptoms suggestive of obstructive sleep apnea, including excessive daytime  sleepiness, today's ESS score is 13, excessive fatigue, will refer him to sleep study    Orders Placed This Encounter  Procedures  . Ambulatory referral to Sleep Studies    New Prescriptions   MEMANTINE (NAMENDA) 10 MG TABLET    Take 1 tablet (10 mg total) by mouth 2 (two) times daily.    Medications Discontinued During This Encounter  Medication Reason  . carbidopa-levodopa (SINEMET) 25-100 MG per tablet Reorder    Return in about 3 months (around 03/20/2015). Levert FeinsteinYijun Kamaal Cast, M.D. Ph.D.  [facility] [address], [address] [place], [place] [address] [phone]

## 2014-12-25 ENCOUNTER — Telehealth: Payer: Self-pay | Admitting: Neurology

## 2014-12-25 DIAGNOSIS — R0683 Snoring: Secondary | ICD-10-CM

## 2014-12-25 DIAGNOSIS — G4719 Other hypersomnia: Secondary | ICD-10-CM

## 2014-12-25 DIAGNOSIS — E663 Overweight: Secondary | ICD-10-CM

## 2014-12-25 NOTE — Telephone Encounter (Signed)
Dr. Levert FeinsteinYijun Yan refers patient for attended sleep study.  Height: 5'10"  Weight: 182 lb  BMI: 26.11  Past Medical History:  Depression    . Diabetes mellitus without complication   . Anxiety   . Hypertension   . Hyperlipidemia   . Gait abnormality   . Speech problem           Sleep Symptoms: Fatigue, excessive daytime sleepiness   Epworth Score: ESS (13)   Medication:  AmLODIPine Besylate (Tab) NORVASC 5 MG Take 5 mg by mouth daily.       Atorvastatin Calcium (Tab) LIPITOR 10 MG Take 10 mg by mouth daily.      Benzonatate (Cap) TESSALON 100 MG Take 1 capsule (100 mg total) by mouth 3 (three) times daily as needed for cough.      Carbidopa-Levodopa (Tab) SINEMET IR 25-100 MG Take 1.5 tablets by mouth 3 (three) times daily.      Doxycycline Hyclate (Tab) VIBRA-TABS 100 MG Take 1 tablet (100 mg total) by mouth 2 (two) times daily.      Labetalol HCl (Tab) NORMODYNE 200 MG Take 200 mg by mouth 2 (two) times daily.      Lisinopril (Tab) PRINIVIL,ZESTRIL 20 MG Take 20 mg by mouth daily.      Lisinopril-Hydrochlorothiazide (Tab) PRINZIDE,ZESTORETIC 20-25 MG Take 1 tablet by mouth daily.      Memantine HCl (Tab) NAMENDA 10 MG Take 1 tablet (10 mg total) by mouth 2 (two) times daily.      MetFORMIN HCl (Tab) GLUCOPHAGE 500 MG Take by mouth 2 (two) times daily with a meal.         Ins: [employer]   Assessment & Plan: Karma GreaserDillard W Gilbo is a 74 y.o. male presenting with gradual onset memory trouble,gait difficulty,, bradykinesia, rigidity on examination,With mild parkinsonian features, Mini-Mental Status Examination 20 out of 30, previous history of orthostatic symptoms,  1, Most suggestive for central nervous system degenerative disorder, Parkinson's disease, versus Lewy body dementia, 2, complains of side effect Sinemet 25/100 mg 2 tablets 3 times a day, will decrease to one and half tablets 3 times a day 3, he has  symptoms suggestive of obstructive sleep apnea, including excessive daytime sleepiness, today's ESS score is 13, excessive fatigue, will refer him to sleep study   Please review patient information and submit instructions for scheduling and orders for sleep technologist. Thank you.

## 2015-01-03 NOTE — Telephone Encounter (Signed)
In House Sleep study request review: This patient has an underlying medical history of parkinsonism, overweight state, hypertension, hyperlipidemia, diabetes, and anxiety, and is referred by Dr. Terrace ArabiaYan for an attended sleep study due to a report of excessive daytime somnolence, snoring. I will order a split-night sleep study and see the patient in sleep medicine consultation afterwards as appropriate. Please print this note and attach to sleep study chart/package.   Sleep Acquisition Technologist instructions: Please score at 4% and split if 2 hour estimated AHI >20/h, unless mandated otherwise by the insurance carrier.    Huston FoleySaima Carles Florea, MD, PhD [facility]

## 2015-01-04 ENCOUNTER — Telehealth: Payer: Self-pay | Admitting: Neurology

## 2015-01-04 NOTE — Telephone Encounter (Signed)
Spoke to son-in-law to schedule the sleep study that was ordered for patient and he stated the patient is not interested in having the study. I closed the order and internal referral at this time.

## 2015-02-22 ENCOUNTER — Telehealth: Payer: Self-pay | Admitting: Neurology

## 2015-02-22 NOTE — Telephone Encounter (Signed)
Left message for a return call

## 2015-02-22 NOTE — Telephone Encounter (Signed)
Aggie Cosierheresa returned the call. Please call back at earliest convenience.

## 2015-02-22 NOTE — Telephone Encounter (Signed)
Pt's daughter Aggie Cosierheresa called and requested to speak with someone regarding the pt. She states that he was having some negative side effects from his Rx. carbidopa-levodopa (SINEMET) 25-100 MG per tablet. The pt decided on his own about a week ago to stop taking the medication because it was making him dizzy. She also states that the pt has been falling a lot recently and she is concerned. She would like to be advised on what they should do about the medication and would also like to know if they should possibly schedule an earlier appt for the pt to come see Dr. Terrace ArabiaYan. Please call and advise.

## 2015-02-22 NOTE — Telephone Encounter (Signed)
Spoke to his daughter - says her father stopped taking his Sinemet because of the dizziness it was causing, despite a dose decrease at his last office visit in March.  His gait has worsened and he has had several falls.  I encouraged her not to let him walk unassisted.  Says he has a walker to use.  They are also working with social services to get some help for him in the home.  I moved his appt to an earlier date.

## 2015-03-06 ENCOUNTER — Encounter: Payer: Self-pay | Admitting: Neurology

## 2015-03-06 ENCOUNTER — Ambulatory Visit (INDEPENDENT_AMBULATORY_CARE_PROVIDER_SITE_OTHER): Payer: Medicare Other | Admitting: Neurology

## 2015-03-06 VITALS — BP 118/73 | HR 76 | Ht 70.0 in | Wt 181.5 lb

## 2015-03-06 DIAGNOSIS — R5382 Chronic fatigue, unspecified: Secondary | ICD-10-CM | POA: Diagnosis not present

## 2015-03-06 DIAGNOSIS — G4710 Hypersomnia, unspecified: Secondary | ICD-10-CM

## 2015-03-06 DIAGNOSIS — R413 Other amnesia: Secondary | ICD-10-CM | POA: Diagnosis not present

## 2015-03-06 DIAGNOSIS — R269 Unspecified abnormalities of gait and mobility: Secondary | ICD-10-CM

## 2015-03-06 DIAGNOSIS — E538 Deficiency of other specified B group vitamins: Secondary | ICD-10-CM | POA: Diagnosis not present

## 2015-03-06 MED ORDER — DONEPEZIL HCL 10 MG PO TABS: 10.0000 mg | ORAL_TABLET | Freq: Every day | ORAL | Status: DC

## 2015-03-06 NOTE — Progress Notes (Addendum)
Chief Complaint  Patient presents with  . Gait Abnormality    He was unable to tolerate Sinemet because of dizziness.  He has stopped it completely.  His gait has worsened and he has had numerous falls.  . Memory Loss    MMSE - animals  . Excessive Sleepiness    He was contacted to schedule a sleep study but declined to schedule.        PATIENT: Raymond Ferrell DOB: [birth year]  HISTORICAL  Gurvir Irven EasterlyW Hantz is of 74 years old right-handed Caucasian male, referred by his primary care physician Dr. Peterson AoKoriala, accompanied by his son in law Amalia HaileyBruce Martin for evaluation of worsening gait difficulty, generalized weakness.  He had a past medical history of hypertension,hyperlipidemia, I have seen him previously in 2013 for evaluation of lightheadedness sensation when getting up quickly, suggestive of orthostatic symptoms, he was going through a lot of stress, separated from his wife,he also complains of depression, anxiety symptoms,  At that time, we had extensive evaluations,  MRI brain (without contrast) demonstrating moderate chronic small vessel ischemic disease. MRI thoracic spine (without contrast) demonstrating: At T11-12 there is mild disc bulging with no spinal stenosis or foraminal narrowing.  no canal stenosis.  MRI cervical spine, multilevel degenerative disc disease, but no significant foraminal or canal stenosis. EMG/NCS, no evidence of large fiber peripheral neuropathy.    Over the past 2 years, he continued to to decline slowly over time, he is much less active, lives in an apartment by himself, still driving, only short distance, could not find his way around into places, getting slower, bilateral lower extremity weakness, gait difficulty, he denies significant pain, no loss sensory of smell, no REM sleep disorder, he does has mild memory trouble. He had a chronic stuttering, sometimes worse than the others  MRI lumbar in 2015 showed mildly increased chronic disc and endplate  degeneration at L4-L5 resulting in mild right lateral recess stenosis. Moderate right greater than left L4 neural foraminal stenosis at this level appears chronic and stable. Otherwise stable lumbar spine compared to 2008, No lumbar spinal stenosis. No findings identified to explain abnormal gait.  He retired from [employer], Engineer, technical salesdriving forklift,  UPDATE Sep 18 2014: I started him on Sinemet 25/100 mg one tablet 3 times a day since November 2015, he has no significant side effect, his gait has slight improvement,  UPDATE December 18 2014; He is now taking sinemet 25/100 2 tabs tid, he complains of sleepiness, it does help him move a little bit better, but he seems to have more sleepiness after taking medications, he had slow worsening memory trouble, more significant difficulty walking   UPDATE June 7th 2016: He is with his son Riley LamDouglas at today's visit,  He lives by himself, but has frequent falling episode since last visit, he will be moved to assisted living facility soon, He has stopped Sinemet did not notice any benefit no improvement in his gait He gave me the example of his fall, he fell sleep in his recliner, got up in the middle of the night walking towards his bed, fell forward, few times, he fell face down when bending over.  He occasionally sees hallucinations, he has no bowel and bladder incontinence.   REVIEW OF SYSTEMS: Full 14 system review of systems performed and notable only for restless leg, walking difficulty  ALLERGIES: No Known Allergies  HOME MEDICATIONS: Current Outpatient Prescriptions on File Prior to Visit  Medication Sig Dispense Refill  . atorvastatin (LIPITOR) 10 MG  tablet Take 10 mg by mouth daily.    Marland Kitchen labetalol (NORMODYNE) 200 MG tablet Take 200 mg by mouth 2 (two) times daily.    Marland Kitchen lisinopril (PRINIVIL,ZESTRIL) 20 MG tablet Take 20 mg by mouth daily.    Marland Kitchen lisinopril-hydrochlorothiazide (PRINZIDE,ZESTORETIC) 20-25 MG per tablet Take 1 tablet by mouth daily.    .  memantine (NAMENDA) 10 MG tablet Take 1 tablet (10 mg total) by mouth 2 (two) times daily. 60 tablet 11  . metFORMIN (GLUCOPHAGE) 500 MG tablet Take by mouth 2 (two) times daily with a meal.     No current facility-administered medications on file prior to visit.    PAST MEDICAL HISTORY: Past Medical History  Diagnosis Date  . Depression   . Diabetes mellitus without complication   . Anxiety   . Hypertension   . Hyperlipidemia   . Gait abnormality   . Speech problem     PAST SURGICAL HISTORY: Past Surgical History  Procedure Laterality Date  . None      FAMILY HISTORY: Family History  Problem Relation Age of Onset  . Cancer Mother   . Cancer Sister     SOCIAL HISTORY:  History   Social History  . Marital Status: Married    Spouse Name: N/A  . Number of Children: 2  . Years of Education: 12 th   Occupational History  .      retired   Social History Main Topics  . Smoking status: Never Smoker   . Smokeless tobacco: Never Used  . Alcohol Use: No  . Drug Use: No  . Sexual Activity: Not on file   Other Topics Concern  . Not on file   Social History Narrative   Patient Lives at home alone .   Retried  Corporate treasurer.   Education 12 th grade.   Right handed.   Caffeine one cup of coffee daily.           PHYSICAL EXAM   Filed Vitals:   03/06/15 0957  BP: 118/73  Pulse: 76  Height:  (1.778 m)  Weight: 181 lb 8 oz (82.328 kg)    Not recorded      Body mass index is 26.04 kg/(m^2).  PHYSICAL EXAMNIATION:  Gen: NAD, conversant, well nourised, obese, well groomed                     Cardiovascular: Regular rate rhythm, no peripheral edema, warm, nontender. Eyes: Conjunctivae clear without exudates or hemorrhage Neck: Supple, no carotid bruise. Pulmonary: Clear to auscultation bilaterally   NEUROLOGICAL EXAM:  MENTAL STATUS: Speech: Mild slow, stuttering speech, Cognition: Mini-Mental Status Examination is 22 out of 30,  he is not  oriented to date, season, missed one out of 3 recalls,  has difficulties spell world backwards,    CRANIAL NERVES: CN II: Visual fields are full to confrontation. Fundoscopic exam fundi were sharp. Pupils were equal round reactive to light. CN III, IV, VI: extraocular movement are normal. No ptosis. CN V: Facial sensation is intact to pinprick in all 3 divisions bilaterally. Corneal responses are intact.  CN VII: Face is symmetric with normal eye closure and smile. CN VIII: Hearing is normal to rubbing fingers CN IX, X: Palate elevates symmetrically. Phonation is normal. CN XI: Head turning and shoulder shrug are intact CN XII: Tongue is midline with normal movements and no atrophy.  MOTOR: He has no muscle atrophy, mild left arm fixation on rapid rotating movement, moderate  limb, and nuchal rigidity, left worse than right, early fatigability on rapid wrist opening and closure, finger tapping, left worse than right.  REFLEXES: Reflexes are 2+ and symmetric at the biceps, triceps, knees, and ankles. Plantar responses are flexor.  SENSORY: Intact to light touch, pinprick, preserved vibratory sensation at toes,   COORDINATION: Rapid alternating movements and fine finger movements are intact. There is no dysmetria on finger-to-nose and heel-knee-shin. There are no abnormal or extraneous movements.   GAIT/STANCE: Stooped forward, cautious, decreased arm swing,  En lock turning, Retropulsed instability,   DIAGNOSTIC DATA (LABS, IMAGING, TESTING) - I reviewed patient records, labs, notes, testing and imaging myself where available.  ASSESSMENT AND PLAN  Suhayb PRITHVI KOOI is a 74 y.o. 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 2. He had slow worsening gait difficulty, fell multiple times, there was no orthostatic blood pressure change on today's examination, 3, keep Namenda 10 mg twice a day, add on Aricept 10 mg daily,  4, he will be moved to assisted living, physical therapy by primary care physician  5, return to clinic 4-6 months with nurse practitioner    Levert Feinstein, M.D. Ph.D.  [facility] [address], [address] [place], [place] [address] [phone]

## 2015-03-20 ENCOUNTER — Ambulatory Visit: Payer: Medicare Other | Admitting: Neurology

## 2015-03-20 ENCOUNTER — Ambulatory Visit: Payer: Self-pay | Admitting: Neurology

## 2015-04-16 ENCOUNTER — Telehealth: Payer: Self-pay | Admitting: Neurology

## 2015-04-16 NOTE — Telephone Encounter (Signed)
Pts, Daughter called and states that the pt has been moved to [facility], [address] [place], [place] [address], [phone], Fax [phone]. The facility needs a medication list of all his medications that he is currently taking. May call daughter if there are questions at [phone], cell [phone].

## 2015-04-17 ENCOUNTER — Emergency Department (HOSPITAL_COMMUNITY)
Admission: EM | Admit: 2015-04-17 | Discharge: 2015-04-17 | Disposition: A | Discharge status: Home / Self Care | Payer: Medicare Other | Attending: Emergency Medicine | Admitting: Emergency Medicine

## 2015-04-17 ENCOUNTER — Emergency Department (HOSPITAL_COMMUNITY): Payer: Medicare Other

## 2015-04-17 ENCOUNTER — Encounter (HOSPITAL_COMMUNITY): Payer: Self-pay | Admitting: Emergency Medicine

## 2015-04-17 DIAGNOSIS — I10 Essential (primary) hypertension: Secondary | ICD-10-CM | POA: Insufficient documentation

## 2015-04-17 DIAGNOSIS — Z7982 Long term (current) use of aspirin: Secondary | ICD-10-CM | POA: Diagnosis not present

## 2015-04-17 DIAGNOSIS — S0001XA Abrasion of scalp, initial encounter: Secondary | ICD-10-CM | POA: Diagnosis not present

## 2015-04-17 DIAGNOSIS — Z8659 Personal history of other mental and behavioral disorders: Secondary | ICD-10-CM | POA: Diagnosis not present

## 2015-04-17 DIAGNOSIS — W1839XA Other fall on same level, initial encounter: Secondary | ICD-10-CM | POA: Diagnosis not present

## 2015-04-17 DIAGNOSIS — Y92129 Unspecified place in nursing home as the place of occurrence of the external cause: Secondary | ICD-10-CM

## 2015-04-17 DIAGNOSIS — Y9389 Activity, other specified: Secondary | ICD-10-CM | POA: Insufficient documentation

## 2015-04-17 DIAGNOSIS — Y92128 Other place in nursing home as the place of occurrence of the external cause: Secondary | ICD-10-CM | POA: Diagnosis not present

## 2015-04-17 DIAGNOSIS — E119 Type 2 diabetes mellitus without complications: Secondary | ICD-10-CM | POA: Diagnosis not present

## 2015-04-17 DIAGNOSIS — S40812A Abrasion of left upper arm, initial encounter: Secondary | ICD-10-CM | POA: Diagnosis not present

## 2015-04-17 DIAGNOSIS — Y998 Other external cause status: Secondary | ICD-10-CM | POA: Insufficient documentation

## 2015-04-17 DIAGNOSIS — W19XXXA Unspecified fall, initial encounter: Secondary | ICD-10-CM

## 2015-04-17 DIAGNOSIS — E785 Hyperlipidemia, unspecified: Secondary | ICD-10-CM | POA: Diagnosis not present

## 2015-04-17 DIAGNOSIS — S0990XA Unspecified injury of head, initial encounter: Secondary | ICD-10-CM | POA: Diagnosis present

## 2015-04-17 DIAGNOSIS — Z79899 Other long term (current) drug therapy: Secondary | ICD-10-CM | POA: Diagnosis not present

## 2015-04-17 NOTE — ED Notes (Signed)
Pt presents from [facility] [place] via EMS for unwitnessed fall. Abrasion to right forearm, skin tear left forearm.  VS: cbg 331, 116/72, 80hr, 14resp

## 2015-04-17 NOTE — Discharge Instructions (Signed)
Your evaluation in the ER today after your fall did not show any broken bones or serious injury.  Please be careful in the future when you are getting out of bed or chair as you may fall again.  Follow up with your doctor for recheck in 2-3 days.  Return to the ER for worsening condition or new concerning symptoms.      FALL PREVENTION (EDU)  FALL PREVENTION (EDU): You have requested information on Fall Prevention.  According to a 2003 study from the Journal of the [employer], more than 1.8 million adults, aged 76 and older, were treated in emergency departments for fall-related injuries. More than 421,000 were hospitalized. The most common injuries from a fall are head injuries that in turn cause a brain injury, and fractures (broken bones). Of all types of broken bones that happen from falls, hip fractures are the most serious and lead to the greatest number of health problems and deaths.  To make your living area safer, older adults should consider the following:      Improve lighting throughout the home. Use night-lights to help you see at night.     Have handrails installed on both sides of stairways.     Have grab bars placed next to the toilet and in the shower. Also consider an elevated toilet seat and a shower chair.     Use non-slip bath mats in the tub or shower.     Remove "throw rugs" to prevent tripping.     Avoid the use of long robes to prevent tripping.     Wear well-fitted shoes or slippers. Wearing loose footwear can cause you to shuffle, and make you more likely to trip and fall. Inexpensive anti-slip socks can also be purchased.     Be sure to keep all electrical cords and small objects out of the pathway.     If a cane, walker or any other assistive device is used, be sure to have them inspected regularly. The devices must be used correctly to prevent injuries.     Remember to move about at a pace that is comfortable for your ability. For  example, do not rush to answer the doorbell or the phone. Take your time. In recent studies, a number of risk factors have been identified that make older adults more likely to have falls. It has also been shown that when these risk factors are modified, it will help to prevent falls.      Exercise: Regular physical activity or exercise increases body strength and improves balance.     Medication Review: Follow up with your doctor and pharmacist as needed to review your medications and any recent changes that may have been made. They can tell you if there are drug interactions and side-effects. If you are taking any sedatives or sleeping pills, it might be possible to have the dosage decreased, or have the number of medications reduced. These kinds of medications can cause drowsiness and dizziness, thus posing a risk of falling.     Vision Checks: Follow up with an eye doctor at least once a year to have your vision checked.  If you develop symptoms of Shortness of Breath, Chest Pain, Swelling of lips, mouth or tongue or if your condition becomes worse with any new symptoms, see your doctor or return to the Emergency Department for immediate care. Emergency services are not intended to be a substitute for comprehensive medical attention.  Please contact your doctor for follow  up if not improving as expected.  ° °Call your doctor in 5-7 days or as directed if there is no improvement.  ° °Community Resources: °*IF YOU ARE IN IMMEDIATE DANGER CALL 911! ° °Abuse/Neglect:  °Family Services Crisis Hotline ([place]): (336) 273-7273 °Center Against Violence ([place]): (336) 342-3331 ° After hours, holidays and weekends: (336) 643-3000 °National Domestic Violence Hotline: 800 799-7233 ° °Mental Health: °[place] [facility]: °[address]: (336) 641-4993 ° °Health Clinics:  °[facility]gent [facility] ([facility]): (336) 832-4400 °Monday - Friday 8 AM - 9 PM, Saturday and Sunday 10 AM - 9  PM ° °[facility] °[address]: (336) [phone] Monday - Friday 8 AM - 5 PM ° °[facility]  °[address]: ([phone]) [phone] Monday- Friday 8:30 AM - 5:30 PM, Sat 9 AM - 1 PM ° °[facility] °[facility] [address]: ([phone]) [phone] °[facility] [address]: (336) 852-2550 °[facility] [address]: (336) 854-7827 ° °[facility] °[facility]: [address] ([phone]) [phone] ° °Cultures: °If culture results are positive, we will notify you if a change in treatment is necessary. ° °LABORATORY TESTS: °        If you had any labs drawn in the ED that have not resulted by the time you are discharged home, we will review these lab results and the treatment given to you.  If there is any further treatment or notification needed, we will contact you by phone, or letter.  "PLEASE ENSURE THAT YOU HAVE GIVEN US YOUR CURRENT WORKING PHONE NUMBER AND YOUR CURRENT ADDRESS, so that we can contact you if needed." ° °RADIOLOGY TESTS:  °If the referred physician wants today’s x-rays, please call the hospital’s Radiology Department the day before your doctor’s appointment. °[place]     [phone] °[place]   [phone] °[place]     [phone] ° °Our doctors and staff appreciate your choosing us for your emergency medical care needs. We are here to serve you ° °

## 2015-04-17 NOTE — ED Provider Notes (Signed)
CSN: [id number]     Arrival date & time 04/17/15  0416 History   First MD Initiated Contact with Patient 04/17/15 0424     Chief Complaint  Patient presents with  . Fall     (Consider location/radiation/quality/duration/timing/severity/associated sxs/prior Treatment) HPI 74 year old male presents to the emergency department via EMS from his nursing home after of unwitnessed fall.  Patient reports he was turning the light off.  Patient with abrasion to right scalp and left forearm.  Patient with slowed responses to questions, this appears to be his baseline.  He denies any complaints at this time. Past Medical History  Diagnosis Date  . Depression   . Diabetes mellitus without complication   . Anxiety   . Hypertension   . Hyperlipidemia   . Gait abnormality   . Speech problem    Past Surgical History  Procedure Laterality Date  . None     Family History  Problem Relation Age of Onset  . Cancer Mother   . Cancer Sister    History  Substance Use Topics  . Smoking status: Never Smoker   . Smokeless tobacco: Never Used  . Alcohol Use: No    Review of Systems Level V caveat, dementia   Allergies  Review of patient's allergies indicates no known allergies.  Home Medications   Prior to Admission medications   Medication Sig Start Date End Date Taking? Authorizing Provider  aspirin 81 MG tablet Take 81 mg by mouth daily.    Historical Provider, MD  atorvastatin (LIPITOR) 10 MG tablet Take 10 mg by mouth daily.    Historical Provider, MD  Docusate Calcium (STOOL SOFTENER PO) Take by mouth as needed.    Historical Provider, MD  donepezil (ARICEPT) 10 MG tablet Take 1 tablet (10 mg total) by mouth at bedtime. 03/06/15   Levert Feinstein, MD  ibuprofen (ADVIL,MOTRIN) 200 MG tablet Take 200 mg by mouth every 6 (six) hours as needed.    Historical Provider, MD  labetalol (NORMODYNE) 200 MG tablet Take 200 mg by mouth 2 (two) times daily.    Historical Provider, MD  lisinopril  (PRINIVIL,ZESTRIL) 20 MG tablet Take 20 mg by mouth daily.    Historical Provider, MD  lisinopril-hydrochlorothiazide (PRINZIDE,ZESTORETIC) 20-25 MG per tablet Take 1 tablet by mouth daily.    Historical Provider, MD  memantine (NAMENDA) 10 MG tablet Take 1 tablet (10 mg total) by mouth 2 (two) times daily. 12/18/14   Levert Feinstein, MD  metFORMIN (GLUCOPHAGE) 500 MG tablet Take by mouth 2 (two) times daily with a meal.    Historical Provider, MD   BP 144/62 mmHg  Pulse 84  Temp(Src) 98 F (36.7 C) (Oral)  Resp 20  SpO2 98% Physical Exam  Constitutional: He is oriented to person, place, and time. He appears well-developed and well-nourished.  HENT:  Head: Normocephalic.  Right Ear: External ear normal.  Left Ear: External ear normal.  Nose: Nose normal.  Mouth/Throat: Oropharynx is clear and moist.  Abrasion to right scalp  Eyes: Conjunctivae and EOM are normal. Pupils are equal, round, and reactive to light.  Neck: Normal range of motion. Neck supple. No JVD present. No tracheal deviation present. No thyromegaly present.  Cardiovascular: Normal rate, regular rhythm, normal heart sounds and intact distal pulses.  Exam reveals no gallop and no friction rub.   No murmur heard. Pulmonary/Chest: Effort normal and breath sounds normal. No stridor. No respiratory distress. He has no wheezes. He has no rales. He exhibits no tenderness.  Abdominal: Soft. Bowel sounds are normal. He exhibits no distension and no mass. There is no tenderness. There is no rebound and no guarding.  Musculoskeletal: Normal range of motion. He exhibits no edema or tenderness.  Skin tear to left forearm  Lymphadenopathy:    He has no cervical adenopathy.  Neurological: He is alert and oriented to person, place, and time. He displays normal reflexes. He exhibits normal muscle tone. Coordination (Cogwheel rigidity noted) abnormal.  Skin: Skin is warm and dry. No rash noted. No erythema. No pallor.  Psychiatric: He has a  normal mood and affect. His behavior is normal. Judgment and thought content normal.  Nursing note and vitals reviewed.   ED Course  Procedures (including critical care time) Labs Review Labs Reviewed - No data to display  Imaging Review Ct Head Wo Contrast  04/17/2015   CLINICAL DATA:  Unwitnessed fall, forehead abrasion. History of diabetes, hypertension, gait abnormality and speech problems.  EXAM: CT HEAD WITHOUT CONTRAST  CT CERVICAL SPINE WITHOUT CONTRAST  TECHNIQUE: Multidetector CT imaging of the head and cervical spine was performed following the standard protocol without intravenous contrast. Multiplanar CT image reconstructions of the cervical spine were also generated.  COMPARISON:  MRI cervical spine August 05, 2011  FINDINGS: CT HEAD FINDINGS  Moderate to severe ventriculomegaly, with overall proportional enlargement of cerebral sulci and cerebellar folia. No intraparenchymal hemorrhage, mass effect nor midline shift. Patchy to confluent supratentorial white matter hypodensities. No acute large vascular territory infarcts. Bilateral inferior basal ganglia perivascular spaces.  No abnormal extra-axial fluid collections. Basal cisterns are patent. Moderate calcific atherosclerosis of the carotid siphons.  Small RIGHT frontal scalp hematoma. No subcutaneous gas or radiopaque foreign bodies there No skull fracture. The included ocular globes and orbital contents are non-suspicious. Status post bilateral ocular lens implants. The mastoid aircells and included paranasal sinuses are well-aerated. Moderate temporomandibular osteoarthrosis.  CT CERVICAL SPINE FINDINGS  Cervical vertebral bodies and posterior elements intact. Grade 1 C2-3 anterolisthesis, stable from prior examination. C1-2 articulation maintained with moderate arthropathy. Moderate to severe C4-5 through C7-T1 disc height loss, endplate sclerosis and marginal spurring consistent with degenerative disc. Severe LEFT C2-3 facet  arthropathy. Severe LEFT C3-4, bilateral C4-5, bilateral C5-6 and RIGHT C6-7 neural foraminal narrowing. Bulky ventral endplate spurring can be seen in the setting of DISH. No destructive bony lesions. Severe RIGHT, moderate LEFT carotid bifurcation calcific atherosclerosis.  IMPRESSION: CT HEAD: Small RIGHT frontal scalp hematoma.  No skull fracture.  No acute intracranial process.  Moderate to severe global brain atrophy, advanced for age. Moderate to severe white matter changes compatible chronic small vessel ischemic disease, advanced for age.  CT CERVICAL SPINE: No acute cervical spine fracture. Stable grade 1 C2-3 anterolisthesis on degenerative basis.  Multilevel severe neural foraminal narrowing.   Electronically Signed   By: Awilda Metro M.D.   On: 04/17/2015 05:33   Ct Cervical Spine Wo Contrast  04/17/2015   CLINICAL DATA:  Unwitnessed fall, forehead abrasion. History of diabetes, hypertension, gait abnormality and speech problems.  EXAM: CT HEAD WITHOUT CONTRAST  CT CERVICAL SPINE WITHOUT CONTRAST  TECHNIQUE: Multidetector CT imaging of the head and cervical spine was performed following the standard protocol without intravenous contrast. Multiplanar CT image reconstructions of the cervical spine were also generated.  COMPARISON:  MRI cervical spine August 05, 2011  FINDINGS: CT HEAD FINDINGS  Moderate to severe ventriculomegaly, with overall proportional enlargement of cerebral sulci and cerebellar folia. No intraparenchymal hemorrhage, mass effect nor midline shift.  Patchy to confluent supratentorial white matter hypodensities. No acute large vascular territory infarcts. Bilateral inferior basal ganglia perivascular spaces.  No abnormal extra-axial fluid collections. Basal cisterns are patent. Moderate calcific atherosclerosis of the carotid siphons.  Small RIGHT frontal scalp hematoma. No subcutaneous gas or radiopaque foreign bodies there No skull fracture. The included ocular globes and  orbital contents are non-suspicious. Status post bilateral ocular lens implants. The mastoid aircells and included paranasal sinuses are well-aerated. Moderate temporomandibular osteoarthrosis.  CT CERVICAL SPINE FINDINGS  Cervical vertebral bodies and posterior elements intact. Grade 1 C2-3 anterolisthesis, stable from prior examination. C1-2 articulation maintained with moderate arthropathy. Moderate to severe C4-5 through C7-T1 disc height loss, endplate sclerosis and marginal spurring consistent with degenerative disc. Severe LEFT C2-3 facet arthropathy. Severe LEFT C3-4, bilateral C4-5, bilateral C5-6 and RIGHT C6-7 neural foraminal narrowing. Bulky ventral endplate spurring can be seen in the setting of DISH. No destructive bony lesions. Severe RIGHT, moderate LEFT carotid bifurcation calcific atherosclerosis.  IMPRESSION: CT HEAD: Small RIGHT frontal scalp hematoma.  No skull fracture.  No acute intracranial process.  Moderate to severe global brain atrophy, advanced for age. Moderate to severe white matter changes compatible chronic small vessel ischemic disease, advanced for age.  CT CERVICAL SPINE: No acute cervical spine fracture. Stable grade 1 C2-3 anterolisthesis on degenerative basis.  Multilevel severe neural foraminal narrowing.   Electronically Signed   By: Awilda Metroourtnay  Bloomer M.D.   On: 04/17/2015 05:33     EKG Interpretation None      MDM   Final diagnoses:  Fall at nursing home, initial encounter  Abrasion of scalp, initial encounter  Abrasion of left arm, initial encounter    74 year old male status post unwitnessed fall.  CT scan of head and C-spine unremarkable.  Effie ShyWentz and address.  He is stable for discharge home.    Marisa Severinlga Henna Derderian, MD 04/17/15 [phone]

## 2015-04-17 NOTE — ED Notes (Signed)
Bed: [id number] Expected date:  Expected time:  Means of arrival:  [facility]

## 2015-04-19 ENCOUNTER — Telehealth: Payer: Self-pay | Admitting: Neurology

## 2015-04-19 NOTE — Telephone Encounter (Signed)
Patients daughter called back and stated that this has still not been taken care of and she has requested to speak with the nurse. Please call and advise.

## 2015-04-19 NOTE — Telephone Encounter (Signed)
The answering service called me at lunch time. Raymond Ferrell is a [facility] and he needs orders provided. The contact is [place] [phone]

## 2015-04-19 NOTE — Telephone Encounter (Signed)
I faxed records/med list today to requested number.  Received confirmation and notified daughter.

## 2015-05-22 ENCOUNTER — Encounter: Payer: Self-pay | Admitting: Neurology

## 2015-05-22 ENCOUNTER — Telehealth: Payer: Self-pay | Admitting: Neurology

## 2015-05-22 ENCOUNTER — Ambulatory Visit (INDEPENDENT_AMBULATORY_CARE_PROVIDER_SITE_OTHER): Payer: Medicare Other | Admitting: Neurology

## 2015-05-22 VITALS — BP 144/80 | HR 68 | Ht 70.0 in | Wt 161.0 lb

## 2015-05-22 DIAGNOSIS — F0280 Dementia in other diseases classified elsewhere without behavioral disturbance: Secondary | ICD-10-CM | POA: Diagnosis not present

## 2015-05-22 DIAGNOSIS — R269 Unspecified abnormalities of gait and mobility: Secondary | ICD-10-CM | POA: Diagnosis not present

## 2015-05-22 DIAGNOSIS — G4710 Hypersomnia, unspecified: Secondary | ICD-10-CM | POA: Diagnosis not present

## 2015-05-22 DIAGNOSIS — G3183 Dementia with Lewy bodies: Secondary | ICD-10-CM

## 2015-05-22 MED ORDER — QUETIAPINE FUMARATE 25 MG PO TABS
ORAL_TABLET | ORAL | Status: DC
Start: 2015-05-22 — End: 2015-09-02

## 2015-05-22 NOTE — Progress Notes (Signed)
Chief Complaint  Patient presents with  . Parksinson's Disease    [name] - 7 animals.  He is here with his daughter, Raymond Ferrell.  His condition has declined and she has multiple concerns.  He is more restless, unsteady gait, increased falls, mood swings, weight loss, and worsening memory.   Chief Complaint  Patient presents with  . Parksinson's Disease    [name] - 7 animals.  He is here with his daughter, Raymond Ferrell.  His condition has declined and she has multiple concerns.  He is more restless, unsteady gait, increased falls, mood swings, weight loss, and worsening memory.      PATIENT: Raymond Ferrell DOB: 26-May-1941  HISTORICAL  Raymond Ferrell is of 74 years old right-handed Caucasian male, referred by his primary care physician Raymond Ferrell, accompanied by his son in law Raymond Ferrell for evaluation of worsening gait difficulty, generalized weakness.  He had a past medical history of hypertension,hyperlipidemia, I have seen him previously in 2013 for evaluation of lightheadedness sensation when getting up quickly, suggestive of orthostatic symptoms, he was going through a lot of stress, separated from his wife,he also complains of depression, anxiety symptoms,  At that time, we had extensive evaluations,  MRI brain (without contrast) demonstrating moderate chronic small vessel ischemic disease. MRI thoracic spine (without contrast) demonstrating: At T11-12 there is mild disc bulging with no spinal stenosis or foraminal narrowing.  no canal stenosis.  MRI cervical spine, multilevel degenerative disc disease, but no significant foraminal or canal stenosis. EMG/NCS, no evidence of large fiber peripheral neuropathy.    Over the past 2 years, he continued to to decline slowly over time, he is much less active, lives in an apartment by himself, still driving, only short distance, could not find his way around into places, getting slower, bilateral lower extremity weakness, gait difficulty,  he denies significant pain, no loss sensory of smell, no REM sleep disorder, he does has mild memory trouble. He had a chronic stuttering, sometimes worse than the others  MRI lumbar in 2015 showed mildly increased chronic disc and endplate degeneration at L4-L5 resulting in mild right lateral recess stenosis. Moderate right greater than left L4 neural foraminal stenosis at this level appears chronic and stable. Otherwise stable lumbar spine compared to 2008, No lumbar spinal stenosis. No findings identified to explain abnormal gait.  He retired from [employer], Engineer, technical sales,  UPDATE Sep 18 2014: I started him on Sinemet 25/100 mg one tablet 3 times a day since November 2015, he has no significant side effect, his gait has slight improvement,  UPDATE December 18 2014; He is now taking sinemet 25/100 2 tabs tid, he complains of sleepiness, it does help him move a little bit better, but he seems to have more sleepiness after taking medications, he had slow worsening memory trouble, more significant difficulty walking   UPDATE June 7th 2016: He is with his son Raymond Ferrell at today's visit,  He lives by himself, but has frequent falling episode since last visit, he will be moved to assisted living facility soon, He has stopped Sinemet did not notice any benefit no improvement in his gait He gave me the example of his fall, he fell sleep in his recliner, got up in the middle of the night walking towards his bed, fell forward, few times, he fell face down when bending over.  He occasionally has visual hallucinations, he has no bowel and bladder incontinence.   Update May 22 2015: He is with her  daughter Raymond Ferrell at today's visit, he is now at assistant living, per resident note, patient has difficulty complying with the physical therapy, evening time agitation, excessive daytime sleepiness, worsening gait difficulty, he has fell multiple times, with bruising his arm, forehead, increased  confusion,  Previously we have tried Sinemet 25/100 mg 2 tablets 3 times a day without improving his gait   REVIEW OF SYSTEMS: Full 14 system review of systems performed and notable only for walking difficulty, bruise easily, memory loss, speech difficulty, weakness, agitation, behavior problems, confusion, depression anxiety  ALLERGIES: No Known Allergies  HOME MEDICATIONS: Current Outpatient Prescriptions on File Prior to Visit  Medication Sig Dispense Refill  . aspirin 81 MG tablet Take 81 mg by mouth daily.    Marland Kitchen atorvastatin (LIPITOR) 10 MG tablet Take 10 mg by mouth daily.    Marland Kitchen donepezil (ARICEPT) 10 MG tablet Take 1 tablet (10 mg total) by mouth at bedtime. 30 tablet 11  . labetalol (NORMODYNE) 200 MG tablet Take 200 mg by mouth 2 (two) times daily.    Marland Kitchen lisinopril-hydrochlorothiazide (PRINZIDE,ZESTORETIC) 20-25 MG per tablet Take 1 tablet by mouth daily.    . memantine (NAMENDA) 10 MG tablet Take 1 tablet (10 mg total) by mouth 2 (two) times daily. 60 tablet 11  . metFORMIN (GLUCOPHAGE) 500 MG tablet Take by mouth 2 (two) times daily with a meal.     No current facility-administered medications on file prior to visit.    PAST MEDICAL HISTORY: Past Medical History  Diagnosis Date  . Depression   . Diabetes mellitus without complication   . Anxiety   . Hypertension   . Hyperlipidemia   . Gait abnormality   . Speech problem     PAST SURGICAL HISTORY: Past Surgical History  Procedure Laterality Date  . None      FAMILY HISTORY: Family History  Problem Relation Age of Onset  . Cancer Mother   . Cancer Sister     SOCIAL HISTORY:  Social History   Social History  . Marital Status: Married    Spouse Name: N/A  . Number of Children: 2  . Years of Education: 12 th   Occupational History  .      retired   Social History Main Topics  . Smoking status: Never Smoker   . Smokeless tobacco: Never Used  . Alcohol Use: No  . Drug Use: No  . Sexual Activity:  Not on file   Other Topics Concern  . Not on file   Social History Narrative   Patient Lives at home alone .   Retried  Corporate treasurer.   Education 12 th grade.   Right handed.   Caffeine one cup of coffee daily.           PHYSICAL EXAM   Filed Vitals:   05/22/15 1457  BP: 144/80  Pulse: 68  Height: 5\' 10"  (1.778 m)  Weight: 161 lb (73.029 kg)    Not recorded      Body mass index is 23.1 kg/(m^2).  PHYSICAL EXAMNIATION:  Gen: NAD, conversant, well nourised, obese, well groomed                     Cardiovascular: Regular rate rhythm, no peripheral edema, warm, nontender. Eyes: Conjunctivae clear without exudates or hemorrhage Neck: Supple, no carotid bruise. Pulmonary: Clear to auscultation bilaterally   NEUROLOGICAL EXAM:  MENTAL STATUS: Speech: Mild slow, stuttering speech, Cognition: Mini-Mental Status Examination is 21 out of 30,  animal naming 7  Orientation: He is not oriented to date, year, month  Registration: Normal  Attention and calculation: He has difficulties spell world backwards  Recall: 3 out of 3  Language: Has difficulty with repetition  Following three-step command  Has difficulty copy figure, and write sentence  CRANIAL NERVES: CN II: Visual fields are full to confrontation. Fundoscopic exam fundi were sharp. Pupils were equal round reactive to light. CN III, IV, VI: He has difficulty with vertical gaze, CN V: Facial sensation is intact to pinprick in all 3 divisions bilaterally. Corneal responses are intact.  CN VII: Face is symmetric with normal eye closure and smile. CN VIII: Hearing is normal to rubbing fingers CN IX, X: Palate elevates symmetrically. Phonation is normal. CN XI: Head turning and shoulder shrug are intact CN XII: Tongue is midline with normal movements and no atrophy.  MOTOR: He has no muscle atrophy, mild left arm fixation on rapid rotating movement, moderate limb, and nuchal rigidity, left worse than right, early  fatigability on rapid wrist opening and closure, finger tapping, left worse than right.  REFLEXES: Reflexes are present  and symmetric at the biceps, triceps, knees, and ankles. Plantar responses are flexor.  SENSORY: Intact to light touch, pinprick, preserved vibratory sensation at toes,   COORDINATION: Rapid alternating movements and fine finger movements are intact. There is no dysmetria on finger-to-nose and heel-knee-shin.   GAIT/STANCE:  he needs 2 people assistant to get up from seated position, significant retropulsed instability, stoop forward posturing, rely on his walker, difficulty initiate gait  DIAGNOSTIC DATA (LABS, IMAGING, TESTING) - I reviewed patient records, labs, notes, testing and imaging myself where available.  ASSESSMENT AND PLAN  Raymond Ferrell is a 74 y.o. male  Dementia with Lewy bodies  Keep Aricept 10 mg daily, Namenda 10 mg twice a day  He has tried and failed Sinemet in the past  Evening time agitations  Add on seroquel 25 mg titrating to 2 tablets every night Gait difficulty, high risk for fall, retropulsed instability  Close monitoring  Continue physical therapy   Levert Feinstein, M.D. Ph.D.  [facility] [address], [address] [place], [place] [address] [phone]

## 2015-05-22 NOTE — Telephone Encounter (Signed)
Dottie with [facility] is calling in reference to Rx Quetiapine(Seroquel)25 mg.  She states she need clarification on the dosage.  It has to be specific on how many pills.  Please call or fax to [phone].  Thanks!

## 2015-05-23 NOTE — Telephone Encounter (Signed)
Per Dr. Terrace Arabia, Seroquel  qhs x one week then increase to  qhs.  Faxed clarification to Dottie's attention and provided our number to call with any further questions.

## 2015-06-01 ENCOUNTER — Emergency Department (HOSPITAL_COMMUNITY): Payer: Medicare Other

## 2015-06-01 ENCOUNTER — Emergency Department (HOSPITAL_COMMUNITY)
Admission: EM | Admit: 2015-06-01 | Discharge: 2015-06-01 | Disposition: A | Discharge status: Home / Self Care | Payer: Medicare Other | Attending: Emergency Medicine | Admitting: Emergency Medicine

## 2015-06-01 ENCOUNTER — Encounter (HOSPITAL_COMMUNITY): Payer: Self-pay | Admitting: Emergency Medicine

## 2015-06-01 DIAGNOSIS — W19XXXA Unspecified fall, initial encounter: Secondary | ICD-10-CM

## 2015-06-01 DIAGNOSIS — W1839XA Other fall on same level, initial encounter: Secondary | ICD-10-CM | POA: Diagnosis not present

## 2015-06-01 DIAGNOSIS — I10 Essential (primary) hypertension: Secondary | ICD-10-CM | POA: Diagnosis not present

## 2015-06-01 DIAGNOSIS — Y9389 Activity, other specified: Secondary | ICD-10-CM | POA: Insufficient documentation

## 2015-06-01 DIAGNOSIS — E785 Hyperlipidemia, unspecified: Secondary | ICD-10-CM | POA: Diagnosis not present

## 2015-06-01 DIAGNOSIS — F419 Anxiety disorder, unspecified: Secondary | ICD-10-CM | POA: Insufficient documentation

## 2015-06-01 DIAGNOSIS — S0181XA Laceration without foreign body of other part of head, initial encounter: Secondary | ICD-10-CM | POA: Insufficient documentation

## 2015-06-01 DIAGNOSIS — Z7982 Long term (current) use of aspirin: Secondary | ICD-10-CM | POA: Insufficient documentation

## 2015-06-01 DIAGNOSIS — E119 Type 2 diabetes mellitus without complications: Secondary | ICD-10-CM | POA: Diagnosis not present

## 2015-06-01 DIAGNOSIS — Y998 Other external cause status: Secondary | ICD-10-CM | POA: Insufficient documentation

## 2015-06-01 DIAGNOSIS — Y92129 Unspecified place in nursing home as the place of occurrence of the external cause: Secondary | ICD-10-CM | POA: Insufficient documentation

## 2015-06-01 DIAGNOSIS — S0993XA Unspecified injury of face, initial encounter: Secondary | ICD-10-CM | POA: Diagnosis present

## 2015-06-01 DIAGNOSIS — Z79899 Other long term (current) drug therapy: Secondary | ICD-10-CM | POA: Diagnosis not present

## 2015-06-01 DIAGNOSIS — F329 Major depressive disorder, single episode, unspecified: Secondary | ICD-10-CM | POA: Diagnosis not present

## 2015-06-01 DIAGNOSIS — R7989 Other specified abnormal findings of blood chemistry: Secondary | ICD-10-CM | POA: Insufficient documentation

## 2015-06-01 LAB — I-STAT CHEM 8, ED
BUN: 32 mg/dL — ABNORMAL HIGH (ref 6–20)
CALCIUM ION: 1.2 mmol/L (ref 1.13–1.30)
CREATININE: 1.4 mg/dL — AB (ref 0.61–1.24)
Chloride: 101 mmol/L (ref 101–111)
GLUCOSE: 118 mg/dL — AB (ref 65–99)
HCT: 34 % — ABNORMAL LOW (ref 39.0–52.0)
HEMOGLOBIN: 11.6 g/dL — AB (ref 13.0–17.0)
POTASSIUM: 4.2 mmol/L (ref 3.5–5.1)
Sodium: 139 mmol/L (ref 135–145)
TCO2: 23 mmol/L (ref 0–100)

## 2015-06-01 LAB — CBG MONITORING, ED: GLUCOSE-CAPILLARY: 112 mg/dL — AB (ref 65–99)

## 2015-06-01 MED ORDER — LIDOCAINE HCL (PF) 1 % IJ SOLN
5.0000 mL | Freq: Once | INTRAMUSCULAR | Status: AC
  Administered 2015-06-01: 5 mL
  Filled 2015-06-01: qty 5

## 2015-06-01 MED ORDER — LIDOCAINE-EPINEPHRINE (PF) 2 %-1:200000 IJ SOLN: 10.0000 mL | Freq: Once | INTRAMUSCULAR | Status: DC

## 2015-06-01 NOTE — ED Provider Notes (Signed)
LACERATION REPAIR Performed by: Dorthula Matas Authorized by: Dorthula Matas Consent: Verbal consent obtained. Risks and benefits: risks, benefits and alternatives were discussed Consent given by: patient Patient identity confirmed: provided demographic data Prepped and Draped in normal sterile fashion Wound explored  Laceration Location: right eyebrow  Laceration Length: 3cm  No Foreign Bodies seen or palpated  Anesthesia: local infiltration  Local anesthetic: lidocaine 2 % with epinephrine  Anesthetic total: 3 ml  Irrigation method: syringe Amount of cleaning: standard  Skin closure: sutures  Number of sutures: 5  Technique: simple interrupted  Patient tolerance: Patient tolerated the procedure well with no immediate complications.  Patient seen by Dr. Bruce Donath, please refer to his note for HPI, ROS, PE and disposition.   Marlon Pel, PA-C 06/01/15 1700  Lorre Nick, MD 06/08/15 [phone]

## 2015-06-01 NOTE — ED Notes (Signed)
Patient transported to CT 

## 2015-06-01 NOTE — ED Provider Notes (Addendum)
CSN: [id number]     Arrival date & time 06/01/15  1406 History   First MD Initiated Contact with Patient 06/01/15 1414     Chief Complaint  Patient presents with  . Fall     (Consider location/radiation/quality/duration/timing/severity/associated sxs/prior Treatment) HPI Comments: Patient here after losing his balance with the nursing home and striking his face. No loss of consciousness. Does have a laceration measuring approximately 3 m above the right eyebrow. Denies any visual changes. No numbness or tingling in his arms or legs. Denies any chest or abdominal discomfort. No low back pain. Denies any symptoms prior to the fall. EMS was called and patient transported here. No treatment use prior to arrival. Patient denies any use of blood thinners at this time.  Patient is a 74 y.o. male presenting with fall. The history is provided by the patient.  Fall    Past Medical History  Diagnosis Date  . Depression   . Diabetes mellitus without complication   . Anxiety   . Hypertension   . Hyperlipidemia   . Gait abnormality   . Speech problem    Past Surgical History  Procedure Laterality Date  . None     Family History  Problem Relation Age of Onset  . Cancer Mother   . Cancer Sister    Social History  Substance Use Topics  . Smoking status: Never Smoker   . Smokeless tobacco: Never Used  . Alcohol Use: No    Review of Systems  All other systems reviewed and are negative.     Allergies  Review of patient's allergies indicates no known allergies.  Home Medications   Prior to Admission medications   Medication Sig Start Date End Date Taking? Authorizing Provider  aspirin 81 MG tablet Take 81 mg by mouth daily.    Historical Provider, MD  atorvastatin (LIPITOR) 10 MG tablet Take 10 mg by mouth daily.    Historical Provider, MD  donepezil (ARICEPT) 10 MG tablet Take 1 tablet (10 mg total) by mouth at bedtime. 03/06/15   Levert Feinstein, MD  labetalol (NORMODYNE) 200 MG tablet  Take 200 mg by mouth 2 (two) times daily.    Historical Provider, MD  lisinopril-hydrochlorothiazide (PRINZIDE,ZESTORETIC) 20-25 MG per tablet Take 1 tablet by mouth daily.    Historical Provider, MD  memantine (NAMENDA) 10 MG tablet Take 1 tablet (10 mg total) by mouth 2 (two) times daily. 12/18/14   Levert Feinstein, MD  metFORMIN (GLUCOPHAGE) 500 MG tablet Take by mouth 2 (two) times daily with a meal.    Historical Provider, MD  QUEtiapine (SEROQUEL) 25 MG tablet One to two tabs po qhs 05/22/15   Levert Feinstein, MD   BP 167/75 mmHg  Pulse 71  Temp(Src) 98.7 F (37.1 C) (Oral)  Resp 20  SpO2 100% Physical Exam  Constitutional: He is oriented to person, place, and time. He appears well-developed and well-nourished.  Non-toxic appearance. No distress.  HENT:  Head: Normocephalic and atraumatic.    Eyes: Conjunctivae, EOM and lids are normal. Pupils are equal, round, and reactive to light.  Neck: Normal range of motion. Neck supple. No tracheal deviation present. No thyroid mass present.  Cardiovascular: Normal rate, regular rhythm and normal heart sounds.  Exam reveals no gallop.   No murmur heard. Pulmonary/Chest: Effort normal and breath sounds normal. No stridor. No respiratory distress. He has no decreased breath sounds. He has no wheezes. He has no rhonchi. He has no rales.  Abdominal: Soft. Normal appearance  and bowel sounds are normal. He exhibits no distension. There is no tenderness. There is no rebound and no CVA tenderness.  Musculoskeletal: Normal range of motion. He exhibits no edema or tenderness.  Patient nontender along the cervical thoracic and lumbar spine.  Neurological: He is alert and oriented to person, place, and time. He has normal strength. No cranial nerve deficit or sensory deficit. GCS eye subscore is 4. GCS verbal subscore is 5. GCS motor subscore is 6.  Skin: Skin is warm and dry. No abrasion and no rash noted.  Psychiatric: His affect is blunt. His speech is delayed. He  is slowed.  Nursing note and vitals reviewed.   ED Course  Procedures (including critical care time) Labs Review Labs Reviewed  CBG MONITORING, ED - Abnormal; Notable for the following:    Glucose-Capillary 112 (*)    All other components within normal limits  I-STAT CHEM 8, ED    Imaging Review No results found. I have personally reviewed and evaluated these images and lab results as part of my medical decision-making.   EKG Interpretation   Date/Time:  Friday June 01 2015 14:14:04 EDT Ventricular Rate:  67 PR Interval:  157 QRS Duration: 99 QT Interval:  409 QTC Calculation: 432 R Axis:   56 Text Interpretation:  Sinus rhythm Baseline wander in lead(s) V5 V6 No  significant change since last tracing Confirmed by Freida Busman  MD, Layliana Devins  ([id number]) on 06/01/2015 2:25:23 PM      MDM   Final diagnoses:  Fall    Laceration to be repaired by physician assistant. Patient's creatinine noted and likely from dehydration due to taking diabetics. Will encourage patient to drink oral fluids. As well as encourage follow-up with his Dr.    Lorre Nick, MD 06/01/15 2130  Lorre Nick, MD 06/01/15 [phone]

## 2015-06-01 NOTE — ED Notes (Signed)
Report called to Darlene at [facility], pt dc home via [facility]

## 2015-06-01 NOTE — ED Notes (Signed)
Bed: RU04 Expected date:  Expected time:  Means of arrival:  Comments: EMS- 74yo M, fall/head lac

## 2015-06-01 NOTE — Discharge Instructions (Signed)
Your creatinine was 1.4 today and this needs to be followed up on by your doctor Facial Laceration  A facial laceration is a cut on the face. These injuries can be painful and cause bleeding. Lacerations usually heal quickly, but they need special care to reduce scarring. DIAGNOSIS  Your health care provider will take a medical history, ask for details about how the injury occurred, and examine the wound to determine how deep the cut is. TREATMENT  Some facial lacerations may not require closure. Others may not be able to be closed because of an increased risk of infection. The risk of infection and the chance for successful closure will depend on various factors, including the amount of time since the injury occurred. The wound may be cleaned to help prevent infection. If closure is appropriate, pain medicines may be given if needed. Your health care provider will use stitches (sutures), wound glue (adhesive), or skin adhesive strips to repair the laceration. These tools bring the skin edges together to allow for faster healing and a better cosmetic outcome. If needed, you may also be given a tetanus shot. HOME CARE INSTRUCTIONS  Only take over-the-counter or prescription medicines as directed by your health care provider.  Follow your health care provider's instructions for wound care. These instructions will vary depending on the technique used for closing the wound. For Sutures:  Keep the wound clean and dry.   If you were given a bandage (dressing), you should change it at least once a day. Also change the dressing if it becomes wet or dirty, or as directed by your health care provider.   Wash the wound with soap and water 2 times a day. Rinse the wound off with water to remove all soap. Pat the wound dry with a clean towel.   After cleaning, apply a thin layer of the antibiotic ointment recommended by your health care provider. This will help prevent infection and keep the dressing from  sticking.   You may shower as usual after the first 24 hours. Do not soak the wound in water until the sutures are removed.   Get your sutures removed as directed by your health care provider. With facial lacerations, sutures should usually be taken out after 4-5 days to avoid stitch marks.   Wait a few days after your sutures are removed before applying any makeup. For Skin Adhesive Strips:  Keep the wound clean and dry.   Do not get the skin adhesive strips wet. You may bathe carefully, using caution to keep the wound dry.   If the wound gets wet, pat it dry with a clean towel.   Skin adhesive strips will fall off on their own. You may trim the strips as the wound heals. Do not remove skin adhesive strips that are still stuck to the wound. They will fall off in time.  For Wound Adhesive:  You may briefly wet your wound in the shower or bath. Do not soak or scrub the wound. Do not swim. Avoid periods of heavy sweating until the skin adhesive has fallen off on its own. After showering or bathing, gently pat the wound dry with a clean towel.   Do not apply liquid medicine, cream medicine, ointment medicine, or makeup to your wound while the skin adhesive is in place. This may loosen the film before your wound is healed.   If a dressing is placed over the wound, be careful not to apply tape directly over the skin adhesive. This  may cause the adhesive to be pulled off before the wound is healed.   Avoid prolonged exposure to sunlight or tanning lamps while the skin adhesive is in place.  The skin adhesive will usually remain in place for 5-10 days, then naturally fall off the skin. Do not pick at the adhesive film.  After Healing: Once the wound has healed, cover the wound with sunscreen during the day for 1 full year. This can help minimize scarring. Exposure to ultraviolet light in the first year will darken the scar. It can take 1-2 years for the scar to lose its redness and to  heal completely.  SEEK IMMEDIATE MEDICAL CARE IF:  You have redness, pain, or swelling around the wound.   You see ayellowish-white fluid (pus) coming from the wound.   You have chills or a fever.  MAKE SURE YOU:  Understand these instructions.  Will watch your condition.  Will get help right away if you are not doing well or get worse. Document Released: 10/23/2004 Document Revised: 07/06/2013 Document Reviewed: 04/28/2013 [facility] Patient Information 2015 [place], [place]. This information is not intended to replace advice given to you by your health care provider. Make sure you discuss any questions you have with your health care provider.

## 2015-06-01 NOTE — ED Notes (Signed)
Arrives via [facility] from [facility], fell from standing, no LOC/vomiting, lac above right eye, bleeding controlled, also abrasion to right face, denies pain, VSS, NAD

## 2015-06-25 ENCOUNTER — Other Ambulatory Visit: Payer: Self-pay | Admitting: Neurology

## 2015-06-26 ENCOUNTER — Other Ambulatory Visit: Payer: Self-pay | Admitting: Neurology

## 2015-06-26 ENCOUNTER — Telehealth: Payer: Self-pay | Admitting: Neurology

## 2015-06-26 NOTE — Telephone Encounter (Signed)
Spoke to [place] at [facility] - they are the contracted pharmacy for [facility], where the patient now resides - he now has the original transferred rx and will get it out to the patient.  His pharmacy is updated in his chart.

## 2015-06-26 NOTE — Telephone Encounter (Signed)
Raymond Ferrell with [facility] is calling to get a refill for Quetiapine 25 mg.  Please call.

## 2015-07-09 ENCOUNTER — Ambulatory Visit: Payer: Self-pay | Admitting: Neurology

## 2015-07-09 ENCOUNTER — Ambulatory Visit: Payer: Medicare Other | Admitting: Neurology

## 2015-07-17 ENCOUNTER — Emergency Department (HOSPITAL_COMMUNITY): Payer: Medicare Other

## 2015-07-17 ENCOUNTER — Emergency Department (HOSPITAL_COMMUNITY)
Admission: EM | Admit: 2015-07-17 | Discharge: 2015-07-17 | Disposition: A | Discharge status: Home / Self Care | Payer: Medicare Other | Attending: Emergency Medicine | Admitting: Emergency Medicine

## 2015-07-17 ENCOUNTER — Encounter (HOSPITAL_COMMUNITY): Payer: Self-pay | Admitting: *Deleted

## 2015-07-17 DIAGNOSIS — Y998 Other external cause status: Secondary | ICD-10-CM | POA: Diagnosis not present

## 2015-07-17 DIAGNOSIS — W19XXXA Unspecified fall, initial encounter: Secondary | ICD-10-CM

## 2015-07-17 DIAGNOSIS — I10 Essential (primary) hypertension: Secondary | ICD-10-CM | POA: Insufficient documentation

## 2015-07-17 DIAGNOSIS — S0181XA Laceration without foreign body of other part of head, initial encounter: Secondary | ICD-10-CM | POA: Insufficient documentation

## 2015-07-17 DIAGNOSIS — Z79899 Other long term (current) drug therapy: Secondary | ICD-10-CM | POA: Diagnosis not present

## 2015-07-17 DIAGNOSIS — Z23 Encounter for immunization: Secondary | ICD-10-CM | POA: Insufficient documentation

## 2015-07-17 DIAGNOSIS — W1839XA Other fall on same level, initial encounter: Secondary | ICD-10-CM | POA: Insufficient documentation

## 2015-07-17 DIAGNOSIS — F329 Major depressive disorder, single episode, unspecified: Secondary | ICD-10-CM | POA: Insufficient documentation

## 2015-07-17 DIAGNOSIS — Y9389 Activity, other specified: Secondary | ICD-10-CM | POA: Insufficient documentation

## 2015-07-17 DIAGNOSIS — S0990XA Unspecified injury of head, initial encounter: Secondary | ICD-10-CM | POA: Diagnosis present

## 2015-07-17 DIAGNOSIS — Y9289 Other specified places as the place of occurrence of the external cause: Secondary | ICD-10-CM | POA: Insufficient documentation

## 2015-07-17 DIAGNOSIS — E119 Type 2 diabetes mellitus without complications: Secondary | ICD-10-CM | POA: Insufficient documentation

## 2015-07-17 DIAGNOSIS — F419 Anxiety disorder, unspecified: Secondary | ICD-10-CM | POA: Diagnosis not present

## 2015-07-17 MED ORDER — TETANUS-DIPHTH-ACELL PERTUSSIS 5-2.5-18.5 LF-MCG/0.5 IM SUSP
0.5000 mL | Freq: Once | INTRAMUSCULAR | Status: AC
  Administered 2015-07-17: 0.5 mL via INTRAMUSCULAR
  Filled 2015-07-17: qty 0.5

## 2015-07-17 NOTE — ED Notes (Signed)
Bed: [id number] Expected date:  Expected time:  Means of arrival:  Comments: EMS-30M FALL

## 2015-07-17 NOTE — ED Notes (Signed)
Called [facility] for transport.  

## 2015-07-17 NOTE — Discharge Instructions (Signed)

## 2015-07-17 NOTE — ED Provider Notes (Signed)
CSN: [id number]     Arrival date & time 07/17/15  1451 History   First MD Initiated Contact with Patient 07/17/15 1511     Chief Complaint  Patient presents with  . Fall     (Consider location/radiation/quality/duration/timing/severity/associated sxs/prior Treatment) HPI Comments: Pt comes in form [facility]. He fell trying to put clothes away. He states that he fall all the time. Denies any pain.  The history is provided by the patient. No language interpreter was used.    Past Medical History  Diagnosis Date  . Depression   . Diabetes mellitus without complication (HCC)   . Anxiety   . Hypertension   . Hyperlipidemia   . Gait abnormality   . Speech problem    Past Surgical History  Procedure Laterality Date  . None     Family History  Problem Relation Age of Onset  . Cancer Mother   . Cancer Sister    Social History  Substance Use Topics  . Smoking status: Never Smoker   . Smokeless tobacco: Never Used  . Alcohol Use: No    Review of Systems  Unable to perform ROS: Dementia      Allergies  Review of patient's allergies indicates no known allergies.  Home Medications   Prior to Admission medications   Medication Sig Start Date End Date Taking? Authorizing Provider  divalproex (DEPAKOTE) 125 MG DR tablet Take 125 mg by mouth daily with breakfast.    Yes Historical Provider, MD  lisinopril-hydrochlorothiazide (PRINZIDE,ZESTORETIC) 20-25 MG per tablet Take 1 tablet by mouth daily with breakfast.    Yes Historical Provider, MD  LORazepam (ATIVAN) 0.5 MG tablet Take 0.5 mg by mouth every 12 (twelve) hours as needed for anxiety (and agitation).   Yes Historical Provider, MD  metFORMIN (GLUCOPHAGE-XR) 500 MG 24 hr tablet Take 500 mg by mouth 2 (two) times daily.   Yes Historical Provider, MD  QUEtiapine (SEROQUEL) 50 MG tablet Take 50 mg by mouth at bedtime.   Yes Historical Provider, MD  donepezil (ARICEPT) 10 MG tablet Take 1 tablet (10 mg total) by mouth at  bedtime. Patient not taking: Reported on 07/17/2015 03/06/15   Levert Feinstein, MD  memantine (NAMENDA) 10 MG tablet Take 1 tablet (10 mg total) by mouth 2 (two) times daily. Patient not taking: Reported on 07/17/2015 12/18/14   Levert Feinstein, MD  QUEtiapine (SEROQUEL) 25 MG tablet One to two tabs po qhs Patient not taking: Reported on 07/17/2015 05/22/15   Levert Feinstein, MD   BP 137/72 mmHg  Pulse 89  Temp(Src) 98.5 F (36.9 C) (Oral)  Resp 18  SpO2 100% Physical Exam  Constitutional: He appears well-developed and well-nourished.  HENT:  Right Ear: External ear normal.  Left Ear: External ear normal.  Laceration to the left forehead and the top of the right scalp.  Cardiovascular: Normal rate and regular rhythm.   Pulmonary/Chest: Effort normal and breath sounds normal. He exhibits no tenderness.  Abdominal: Soft. Bowel sounds are normal. There is no tenderness.  Musculoskeletal: Normal range of motion.  Moving all extremities without any problem  Neurological: He is alert. He exhibits normal muscle tone. Coordination normal.  Skin:  Abrasion and laceration to the left forehead  Nursing note and vitals reviewed.   ED Course  .Marland KitchenLaceration Repair Date/Time: 07/17/2015 4:24 PM Performed by: Teressa Lower Authorized by: Teressa Lower Consent: Verbal consent obtained. Risks and benefits: risks, benefits and alternatives were discussed Consent given by: patient Patient identity confirmed: verbally with  patient Body area: head/neck Location details: forehead Laceration length: 1.5 cm Foreign bodies: no foreign bodies Skin closure: glue Technique: simple Approximation: close Approximation difficulty: simple Patient tolerance: Patient tolerated the procedure well with no immediate complications   (including critical care time) Labs Review Labs Reviewed - No data to display  Imaging Review Ct Head Wo Contrast  07/17/2015  CLINICAL DATA:  Fall.  Head injury EXAM: CT HEAD WITHOUT  CONTRAST CT CERVICAL SPINE WITHOUT CONTRAST TECHNIQUE: Multidetector CT imaging of the head and cervical spine was performed following the standard protocol without intravenous contrast. Multiplanar CT image reconstructions of the cervical spine were also generated. COMPARISON:  CT 06/01/2015 FINDINGS: CT HEAD FINDINGS Moderate to advanced atrophy. Chronic microvascular ischemic changes in the white matter. Negative for acute infarct.  Negative for hemorrhage or mass. Negative for skull fracture. CT CERVICAL SPINE FINDINGS Normal cervical alignment.  Negative for fracture. Multilevel disc degeneration and spondylosis causing spinal and foraminal stenosis at multiple levels Large anterior osteophytes through C3, C4 and C5 indenting the pharynx. These could be a cause of dysphagia. Carotid artery calcification. IMPRESSION: Atrophy and chronic ischemia.  No acute intracranial abnormality. Cervical degenerative changes as above. Negative for cervical spine fracture. Electronically Signed   By: Marlan Palauharles  Clark M.D.   On: 07/17/2015 15:58   Ct Cervical Spine Wo Contrast  07/17/2015  CLINICAL DATA:  Fall.  Head injury EXAM: CT HEAD WITHOUT CONTRAST CT CERVICAL SPINE WITHOUT CONTRAST TECHNIQUE: Multidetector CT imaging of the head and cervical spine was performed following the standard protocol without intravenous contrast. Multiplanar CT image reconstructions of the cervical spine were also generated. COMPARISON:  CT 06/01/2015 FINDINGS: CT HEAD FINDINGS Moderate to advanced atrophy. Chronic microvascular ischemic changes in the white matter. Negative for acute infarct.  Negative for hemorrhage or mass. Negative for skull fracture. CT CERVICAL SPINE FINDINGS Normal cervical alignment.  Negative for fracture. Multilevel disc degeneration and spondylosis causing spinal and foraminal stenosis at multiple levels Large anterior osteophytes through C3, C4 and C5 indenting the pharynx. These could be a cause of dysphagia.  Carotid artery calcification. IMPRESSION: Atrophy and chronic ischemia.  No acute intracranial abnormality. Cervical degenerative changes as above. Negative for cervical spine fracture. Electronically Signed   By: Marlan Palauharles  Clark M.D.   On: 07/17/2015 15:58   I have personally reviewed and evaluated these images and lab results as part of my medical decision-making.   EKG Interpretation None      MDM   Final diagnoses:  Fall, initial encounter  Facial laceration, initial encounter    Pt fall likely related to parkinson's:no acute head injury. Pt is okay to follow up as needed. Pt tdap updated.    Teressa LowerVrinda Magdalina Whitehead, NP 07/17/15 1625  Pricilla LovelessScott Goldston, MD 07/18/15 [phone]

## 2015-07-17 NOTE — ED Notes (Signed)
Pt stood at bedside to use urinal; very unsteady on feet so RN assisted throughout.  Pt returned to a comfortable position in bed with both side rails raised for safety.  Family at bedside.

## 2015-07-17 NOTE — ED Notes (Signed)
Pt transported to CT, family present at the bedside.

## 2015-07-17 NOTE — ED Notes (Signed)
Pt is a resident of [facility].  He had a fall while working in his closet and Bumped his head.  No blood thinners.  No c/o pain

## 2015-08-28 ENCOUNTER — Ambulatory Visit: Payer: Self-pay | Admitting: Neurology

## 2015-09-01 ENCOUNTER — Inpatient Hospital Stay (HOSPITAL_COMMUNITY)
Admission: EM | Admit: 2015-09-01 | Discharge: 2015-09-06 | DRG: 085 | Disposition: A | Discharge status: Hospice | Payer: Medicare Other | Attending: Internal Medicine | Admitting: Internal Medicine

## 2015-09-01 ENCOUNTER — Emergency Department (HOSPITAL_COMMUNITY): Payer: Medicare Other

## 2015-09-01 ENCOUNTER — Encounter (HOSPITAL_COMMUNITY): Payer: Self-pay | Admitting: Emergency Medicine

## 2015-09-01 DIAGNOSIS — X58XXXA Exposure to other specified factors, initial encounter: Secondary | ICD-10-CM | POA: Diagnosis present

## 2015-09-01 DIAGNOSIS — Z809 Family history of malignant neoplasm, unspecified: Secondary | ICD-10-CM

## 2015-09-01 DIAGNOSIS — R4781 Slurred speech: Secondary | ICD-10-CM | POA: Diagnosis present

## 2015-09-01 DIAGNOSIS — Y92129 Unspecified place in nursing home as the place of occurrence of the external cause: Secondary | ICD-10-CM

## 2015-09-01 DIAGNOSIS — E785 Hyperlipidemia, unspecified: Secondary | ICD-10-CM | POA: Diagnosis present

## 2015-09-01 DIAGNOSIS — R479 Unspecified speech disturbances: Secondary | ICD-10-CM | POA: Diagnosis present

## 2015-09-01 DIAGNOSIS — Z79899 Other long term (current) drug therapy: Secondary | ICD-10-CM

## 2015-09-01 DIAGNOSIS — R269 Unspecified abnormalities of gait and mobility: Secondary | ICD-10-CM | POA: Diagnosis present

## 2015-09-01 DIAGNOSIS — M436 Torticollis: Secondary | ICD-10-CM | POA: Diagnosis present

## 2015-09-01 DIAGNOSIS — S065X9A Traumatic subdural hemorrhage with loss of consciousness of unspecified duration, initial encounter: Secondary | ICD-10-CM

## 2015-09-01 DIAGNOSIS — Z7984 Long term (current) use of oral hypoglycemic drugs: Secondary | ICD-10-CM

## 2015-09-01 DIAGNOSIS — S065X0A Traumatic subdural hemorrhage without loss of consciousness, initial encounter: Secondary | ICD-10-CM | POA: Diagnosis not present

## 2015-09-01 DIAGNOSIS — F329 Major depressive disorder, single episode, unspecified: Secondary | ICD-10-CM | POA: Diagnosis present

## 2015-09-01 DIAGNOSIS — G9340 Encephalopathy, unspecified: Secondary | ICD-10-CM | POA: Diagnosis present

## 2015-09-01 DIAGNOSIS — R4182 Altered mental status, unspecified: Secondary | ICD-10-CM | POA: Diagnosis not present

## 2015-09-01 DIAGNOSIS — R509 Fever, unspecified: Secondary | ICD-10-CM | POA: Diagnosis present

## 2015-09-01 DIAGNOSIS — F419 Anxiety disorder, unspecified: Secondary | ICD-10-CM | POA: Diagnosis present

## 2015-09-01 DIAGNOSIS — Z66 Do not resuscitate: Secondary | ICD-10-CM | POA: Diagnosis present

## 2015-09-01 DIAGNOSIS — S065XAA Traumatic subdural hemorrhage with loss of consciousness status unknown, initial encounter: Secondary | ICD-10-CM

## 2015-09-01 DIAGNOSIS — E119 Type 2 diabetes mellitus without complications: Secondary | ICD-10-CM | POA: Diagnosis present

## 2015-09-01 DIAGNOSIS — I10 Essential (primary) hypertension: Secondary | ICD-10-CM | POA: Diagnosis present

## 2015-09-01 DIAGNOSIS — Y939 Activity, unspecified: Secondary | ICD-10-CM

## 2015-09-01 DIAGNOSIS — F0390 Unspecified dementia without behavioral disturbance: Secondary | ICD-10-CM | POA: Diagnosis present

## 2015-09-01 DIAGNOSIS — Z515 Encounter for palliative care: Secondary | ICD-10-CM | POA: Diagnosis present

## 2015-09-01 DIAGNOSIS — F0280 Dementia in other diseases classified elsewhere without behavioral disturbance: Secondary | ICD-10-CM | POA: Diagnosis present

## 2015-09-01 DIAGNOSIS — I160 Hypertensive urgency: Secondary | ICD-10-CM | POA: Diagnosis present

## 2015-09-01 DIAGNOSIS — G3183 Dementia with Lewy bodies: Secondary | ICD-10-CM | POA: Diagnosis present

## 2015-09-01 DIAGNOSIS — Z7401 Bed confinement status: Secondary | ICD-10-CM

## 2015-09-01 HISTORY — DX: Dementia in other diseases classified elsewhere, unspecified severity, without behavioral disturbance, psychotic disturbance, mood disturbance, and anxiety: F02.80

## 2015-09-01 HISTORY — DX: Neurocognitive disorder with Lewy bodies: G31.83

## 2015-09-01 LAB — CBC WITH DIFFERENTIAL/PLATELET
BASOS ABS: 0 10*3/uL (ref 0.0–0.1)
Basophils Relative: 0 %
EOS PCT: 0 %
Eosinophils Absolute: 0 10*3/uL (ref 0.0–0.7)
HEMATOCRIT: 36.3 % — AB (ref 39.0–52.0)
Hemoglobin: 12.1 g/dL — ABNORMAL LOW (ref 13.0–17.0)
LYMPHS ABS: 1.2 10*3/uL (ref 0.7–4.0)
LYMPHS PCT: 12 %
MCH: 30.2 pg (ref 26.0–34.0)
MCHC: 33.3 g/dL (ref 30.0–36.0)
MCV: 90.5 fL (ref 78.0–100.0)
MONO ABS: 0.7 10*3/uL (ref 0.1–1.0)
Monocytes Relative: 8 %
NEUTROS ABS: 7.7 10*3/uL (ref 1.7–7.7)
Neutrophils Relative %: 80 %
Platelets: 347 10*3/uL (ref 150–400)
RBC: 4.01 MIL/uL — AB (ref 4.22–5.81)
RDW: 13.2 % (ref 11.5–15.5)
WBC: 9.7 10*3/uL (ref 4.0–10.5)

## 2015-09-01 LAB — I-STAT CG4 LACTIC ACID, ED: LACTIC ACID, VENOUS: 0.83 mmol/L (ref 0.5–2.0)

## 2015-09-01 MED ORDER — PIPERACILLIN-TAZOBACTAM 3.375 G IVPB
3.3750 g | Freq: Once | INTRAVENOUS | Status: AC
  Administered 2015-09-02: 3.375 g via INTRAVENOUS
  Filled 2015-09-01: qty 50

## 2015-09-01 MED ORDER — SODIUM CHLORIDE 0.9 % IV SOLN
1000.0000 mL | INTRAVENOUS | Status: DC
  Administered 2015-09-01 – 2015-09-02 (×2): 1000 mL via INTRAVENOUS

## 2015-09-01 MED ORDER — VANCOMYCIN HCL IN DEXTROSE 1-5 GM/200ML-% IV SOLN
1000.0000 mg | Freq: Once | INTRAVENOUS | Status: AC
  Administered 2015-09-02: 1000 mg via INTRAVENOUS
  Filled 2015-09-01: qty 200

## 2015-09-01 NOTE — ED Provider Notes (Addendum)
CSN: [id number]     Arrival date & time 09/01/15  2221 History   First MD Initiated Contact with Patient 09/01/15 2301     Chief Complaint  Patient presents with  . Altered Mental Status  . Fever     (Consider location/radiation/quality/duration/timing/severity/associated sxs/prior Treatment) HPI Comments: Patient presents to the ER for mental status changes. Patient comes from skilled nursing facility. Patient was reportedly his normal self yesterday, but since awakening this morning at 6 AM has been noted to be confused and less responsive. Patient does have baseline dementia. Information provided by nursing staff via EMS. He cannot answer any questions. Level V Caveat due to dementia and mental status change.  Patient is a 74 y.o. male presenting with altered mental status and fever.  Altered Mental Status Associated symptoms: fever   Fever   Past Medical History  Diagnosis Date  . Depression   . Diabetes mellitus without complication (HCC)   . Anxiety   . Hypertension   . Hyperlipidemia   . Gait abnormality   . Speech problem   . Parkinson's disease, Lewy body [facility])    Past Surgical History  Procedure Laterality Date  . None     Family History  Problem Relation Age of Onset  . Cancer Mother   . Cancer Sister    Social History  Substance Use Topics  . Smoking status: Never Smoker   . Smokeless tobacco: Never Used  . Alcohol Use: No    Review of Systems  Unable to perform ROS: Dementia  Constitutional: Positive for fever.      Allergies  Review of patient's allergies indicates no known allergies.  Home Medications   Prior to Admission medications   Medication Sig Start Date End Date Taking? Authorizing Provider  acetaminophen (TYLENOL) 500 MG tablet Take 500 mg by mouth every 8 (eight) hours as needed for moderate pain.   Yes Historical Provider, MD  LORazepam (ATIVAN) 0.5 MG tablet Take 0.5 mg by mouth every 12 (twelve) hours as needed for anxiety (and  agitation).   Yes Historical Provider, MD  mirtazapine (REMERON) 15 MG tablet Take 15 mg by mouth at bedtime.   Yes Historical Provider, MD  QUEtiapine (SEROQUEL) 50 MG tablet Take 25 mg by mouth at bedtime.    Yes Historical Provider, MD   BP 177/79 mmHg  Pulse 69  Temp(Src) 100.4 F (38 C) (Rectal)  Resp 14  SpO2 99% Physical Exam  Constitutional: He appears well-developed and well-nourished. He appears lethargic. No distress.  HENT:  Head: Normocephalic and atraumatic.  Right Ear: Hearing normal.  Left Ear: Hearing normal.  Nose: Nose normal.  Mouth/Throat: Oropharynx is clear and moist and mucous membranes are normal.  Eyes: Conjunctivae and EOM are normal. Pupils are equal, round, and reactive to light.  Neck: Normal range of motion. Neck supple.  Cardiovascular: Regular rhythm, S1 normal and S2 normal.  Exam reveals no gallop and no friction rub.   Murmur heard. Pulmonary/Chest: Effort normal and breath sounds normal. No respiratory distress. He exhibits no tenderness.  Abdominal: Soft. Normal appearance and bowel sounds are normal. There is no hepatosplenomegaly. There is no tenderness. There is no rebound, no guarding, no tenderness at McBurney's point and negative Murphy's sign. No hernia.  Musculoskeletal: Normal range of motion.  Neurological: He has normal strength. He appears lethargic. No cranial nerve deficit or sensory deficit. Coordination normal. GCS eye subscore is 3. GCS verbal subscore is 4. GCS motor subscore is 6.  Skin:  Skin is warm, dry and intact. No rash noted. No cyanosis.  Psychiatric: He has a normal mood and affect. His speech is normal and behavior is normal. Thought content normal.  Nursing note and vitals reviewed.   ED Course  Procedures (including critical care time) Labs Review Labs Reviewed  COMPREHENSIVE METABOLIC PANEL - Abnormal; Notable for the following:    Glucose, Bld 143 (*)    All other components within normal limits  CBC WITH  DIFFERENTIAL/PLATELET - Abnormal; Notable for the following:    RBC 4.01 (*)    Hemoglobin 12.1 (*)    HCT 36.3 (*)    All other components within normal limits  URINALYSIS, ROUTINE W REFLEX MICROSCOPIC (NOT AT [facility]) - Abnormal; Notable for the following:    Hgb urine dipstick MODERATE (*)    All other components within normal limits  URINE MICROSCOPIC-ADD ON - Abnormal; Notable for the following:    Bacteria, UA FEW (*)    All other components within normal limits  CULTURE, BLOOD (ROUTINE X 2)  CULTURE, BLOOD (ROUTINE X 2)  URINE CULTURE  INFLUENZA PANEL BY PCR (TYPE A & B, H1N1)  HEMOGLOBIN A1C  LIPID PANEL  LACTIC ACID, PLASMA  LACTIC ACID, PLASMA  PROCALCITONIN  PROTIME-INR  APTT  CBC  BASIC METABOLIC PANEL  I-STAT CG4 LACTIC ACID, ED  I-STAT CG4 LACTIC ACID, ED  I-STAT TROPOININ, ED    Imaging Review Ct Head Wo Contrast  09/02/2015  CLINICAL DATA:  Altered mental status EXAM: CT HEAD WITHOUT CONTRAST TECHNIQUE: Contiguous axial images were obtained from the base of the skull through the vertex without intravenous contrast. COMPARISON:  07/17/2015 FINDINGS: There is a large left subdural hematoma, covering essentially the entire left cerebral hemisphere. Depth is approximately 7 mm. The subdural continues on to the left tentorium. There is 6 mm left-to-right midline shift. There is moderate effacement of the left lateral ventricle. There is marked sulcal effacement in the left cerebral hemisphere. There is moderate generalized atrophy. Basal cisterns remain patent. There is marked hemispheric white matter hypodensity suggesting chronic small vessel ischemic disease. No intraventricular or intraparenchymal hemorrhage is evident. No bony abnormality is evident. IMPRESSION: Large left subdural hematoma with left-to-right midline shift and with moderate mass effect. Basal cisterns remain patent. Critical Value/emergent results were called by telephone at the time of interpretation on  09/02/2015 at 3:36 am to Dr. Glendora ScoreJohn Saylor Murry, who verbally acknowledged these results. Electronically Signed   By: Ellery Plunkaniel R Mitchell M.D.   On: 09/02/2015 03:40   Dg Chest Port 1 View  09/01/2015  CLINICAL DATA:  Altered mental status. EXAM: PORTABLE CHEST 1 VIEW COMPARISON:  None. FINDINGS: A single AP portable view of the chest demonstrates no focal airspace consolidation or alveolar edema. The lungs are grossly clear. There is no large effusion or pneumothorax. Cardiac and mediastinal contours appear unremarkable. IMPRESSION: No active disease. Electronically Signed   By: Ellery Plunkaniel R Mitchell M.D.   On: 09/01/2015 23:16   I have personally reviewed and evaluated these images and lab results as part of my medical decision-making.   EKG Interpretation None      MDM   Final diagnoses:  Parkinson's disease, Lewy body (HCC)   fever  Acute encephalopathy  Delirium  Possible sepsis  SDH  Presents to the ER for evaluation of acute encephalopathy. He has been runnig a fever at nursing home. Patient reportedly has been less responsive than normal, more confused since 6 AM. Upon arrival to the ER, he is  somnolent, awakens to voice but very confused. He did have a low-grade fever here in the ER. He was initiated on sepsis protocol treatment including broad-spectrum antibiotics. Source of fever is unclear at this time. Chest x-ray does not show pneumonia. Urinalysis does not show obvious infection. Blood and urine cultures pending. Influenza panel has been ordered.   Dr. Clyde Lundborg, hospitalist was consulted. He did order additional imaging including CT head. This did show evidence of a subdural hematoma. Extensive conversations with family reveal that they do not feel that he is a surgical candidate and do not wish to undergo surgery. I did briefly discuss with Dr. Bevely Palmer, on-call for neurosurgery. He did agree that the patient likely is a poor surgical candidate and therefore should not undergo intervention for  this. He did not feel the patient required transfer to [facility] of your not going to perform surgery. He felt the patient could be monitored here. Patient will be admitted, monitored closely and stepdown unit. I did have a lengthy conversation with the patient's family indicating that his prognosis is poor and he may die from this condition. They understand. They wish for him to be kept comfortable.  CRITICAL CARE Performed by: Gilda Crease   Total critical care time: 30 minutes  Critical care time was exclusive of separately billable procedures and treating other patients.  Critical care was necessary to treat or prevent imminent or life-threatening deterioration.  Critical care was time spent personally by me on the following activities: development of treatment plan with patient and/or surrogate as well as nursing, discussions with consultants, evaluation of patient's response to treatment, examination of patient, obtaining history from patient or surrogate, ordering and performing treatments and interventions, ordering and review of laboratory studies, ordering and review of radiographic studies, pulse oximetry and re-evaluation of patient's condition.    Gilda Crease, MD 09/02/15 1610  Gilda Crease, MD 09/02/15 [phone]

## 2015-09-01 NOTE — ED Notes (Signed)
Per EMS pt is a resident at [facility]  Staff states pt woke up at 6am and has been less responsive today than normal   Pt has hx of dementia  Pt will answer questions when you wake him up but he has been lethargic all day  No obvious neuro deficits noted  12 lead by EMS was unremarkable

## 2015-09-02 ENCOUNTER — Inpatient Hospital Stay (HOSPITAL_COMMUNITY): Payer: Medicare Other

## 2015-09-02 ENCOUNTER — Encounter (HOSPITAL_COMMUNITY): Payer: Self-pay | Admitting: Internal Medicine

## 2015-09-02 DIAGNOSIS — R410 Disorientation, unspecified: Secondary | ICD-10-CM | POA: Insufficient documentation

## 2015-09-02 DIAGNOSIS — M436 Torticollis: Secondary | ICD-10-CM | POA: Diagnosis present

## 2015-09-02 DIAGNOSIS — Z809 Family history of malignant neoplasm, unspecified: Secondary | ICD-10-CM | POA: Diagnosis not present

## 2015-09-02 DIAGNOSIS — F329 Major depressive disorder, single episode, unspecified: Secondary | ICD-10-CM | POA: Diagnosis present

## 2015-09-02 DIAGNOSIS — Y939 Activity, unspecified: Secondary | ICD-10-CM | POA: Diagnosis not present

## 2015-09-02 DIAGNOSIS — F0390 Unspecified dementia without behavioral disturbance: Secondary | ICD-10-CM | POA: Diagnosis present

## 2015-09-02 DIAGNOSIS — G3183 Dementia with Lewy bodies: Secondary | ICD-10-CM | POA: Diagnosis not present

## 2015-09-02 DIAGNOSIS — Z7984 Long term (current) use of oral hypoglycemic drugs: Secondary | ICD-10-CM | POA: Diagnosis not present

## 2015-09-02 DIAGNOSIS — S065XAA Traumatic subdural hemorrhage with loss of consciousness status unknown, initial encounter: Secondary | ICD-10-CM | POA: Diagnosis present

## 2015-09-02 DIAGNOSIS — G9340 Encephalopathy, unspecified: Secondary | ICD-10-CM | POA: Diagnosis present

## 2015-09-02 DIAGNOSIS — I6200 Nontraumatic subdural hemorrhage, unspecified: Secondary | ICD-10-CM | POA: Diagnosis not present

## 2015-09-02 DIAGNOSIS — F0280 Dementia in other diseases classified elsewhere without behavioral disturbance: Secondary | ICD-10-CM | POA: Diagnosis present

## 2015-09-02 DIAGNOSIS — Z515 Encounter for palliative care: Secondary | ICD-10-CM | POA: Diagnosis present

## 2015-09-02 DIAGNOSIS — R4789 Other speech disturbances: Secondary | ICD-10-CM

## 2015-09-02 DIAGNOSIS — R4781 Slurred speech: Secondary | ICD-10-CM | POA: Diagnosis present

## 2015-09-02 DIAGNOSIS — Z66 Do not resuscitate: Secondary | ICD-10-CM | POA: Diagnosis present

## 2015-09-02 DIAGNOSIS — R269 Unspecified abnormalities of gait and mobility: Secondary | ICD-10-CM

## 2015-09-02 DIAGNOSIS — S065X0A Traumatic subdural hemorrhage without loss of consciousness, initial encounter: Secondary | ICD-10-CM | POA: Diagnosis present

## 2015-09-02 DIAGNOSIS — R509 Fever, unspecified: Secondary | ICD-10-CM | POA: Diagnosis present

## 2015-09-02 DIAGNOSIS — S065X9A Traumatic subdural hemorrhage with loss of consciousness of unspecified duration, initial encounter: Secondary | ICD-10-CM | POA: Diagnosis present

## 2015-09-02 DIAGNOSIS — Y92129 Unspecified place in nursing home as the place of occurrence of the external cause: Secondary | ICD-10-CM | POA: Diagnosis not present

## 2015-09-02 DIAGNOSIS — R4182 Altered mental status, unspecified: Secondary | ICD-10-CM | POA: Diagnosis present

## 2015-09-02 DIAGNOSIS — Z789 Other specified health status: Secondary | ICD-10-CM | POA: Diagnosis not present

## 2015-09-02 DIAGNOSIS — Z7401 Bed confinement status: Secondary | ICD-10-CM | POA: Diagnosis not present

## 2015-09-02 DIAGNOSIS — E119 Type 2 diabetes mellitus without complications: Secondary | ICD-10-CM | POA: Diagnosis present

## 2015-09-02 DIAGNOSIS — Z79899 Other long term (current) drug therapy: Secondary | ICD-10-CM | POA: Diagnosis not present

## 2015-09-02 DIAGNOSIS — I10 Essential (primary) hypertension: Secondary | ICD-10-CM | POA: Diagnosis present

## 2015-09-02 DIAGNOSIS — X58XXXA Exposure to other specified factors, initial encounter: Secondary | ICD-10-CM | POA: Diagnosis present

## 2015-09-02 DIAGNOSIS — F419 Anxiety disorder, unspecified: Secondary | ICD-10-CM | POA: Diagnosis present

## 2015-09-02 DIAGNOSIS — E785 Hyperlipidemia, unspecified: Secondary | ICD-10-CM | POA: Diagnosis present

## 2015-09-02 DIAGNOSIS — I160 Hypertensive urgency: Secondary | ICD-10-CM | POA: Diagnosis present

## 2015-09-02 LAB — COMPREHENSIVE METABOLIC PANEL
ALBUMIN: 4.5 g/dL (ref 3.5–5.0)
ALK PHOS: 109 U/L (ref 38–126)
ALT: 25 U/L (ref 17–63)
ANION GAP: 10 (ref 5–15)
AST: 19 U/L (ref 15–41)
BUN: 19 mg/dL (ref 6–20)
CALCIUM: 9.5 mg/dL (ref 8.9–10.3)
CHLORIDE: 103 mmol/L (ref 101–111)
CO2: 25 mmol/L (ref 22–32)
Creatinine, Ser: 1.12 mg/dL (ref 0.61–1.24)
GFR calc non Af Amer: >60 mL/min (ref >60)
GLUCOSE: 143 mg/dL — AB (ref 65–99)
POTASSIUM: 4.2 mmol/L (ref 3.5–5.1)
SODIUM: 138 mmol/L (ref 135–145)
Total Bilirubin: 1.1 mg/dL (ref 0.3–1.2)
Total Protein: 7.5 g/dL (ref 6.5–8.1)

## 2015-09-02 LAB — BASIC METABOLIC PANEL
ANION GAP: 9 (ref 5–15)
BUN: 17 mg/dL (ref 6–20)
CALCIUM: 9 mg/dL (ref 8.9–10.3)
CO2: 24 mmol/L (ref 22–32)
CREATININE: 1.02 mg/dL (ref 0.61–1.24)
Chloride: 106 mmol/L (ref 101–111)
GFR calc Af Amer: >60 mL/min (ref >60)
GLUCOSE: 134 mg/dL — AB (ref 65–99)
Potassium: 3.8 mmol/L (ref 3.5–5.1)
Sodium: 139 mmol/L (ref 135–145)

## 2015-09-02 LAB — URINALYSIS, ROUTINE W REFLEX MICROSCOPIC
Bilirubin Urine: NEGATIVE
GLUCOSE, UA: NEGATIVE mg/dL
KETONES UR: NEGATIVE mg/dL
LEUKOCYTES UA: NEGATIVE
NITRITE: NEGATIVE
PH: 6 (ref 5.0–8.0)
Protein, ur: NEGATIVE mg/dL
SPECIFIC GRAVITY, URINE: 1.017 (ref 1.005–1.030)

## 2015-09-02 LAB — LIPID PANEL
Cholesterol: 199 mg/dL (ref 0–200)
HDL: 50 mg/dL (ref >40)
LDL Cholesterol: 131 mg/dL — ABNORMAL HIGH (ref 0–99)
Total CHOL/HDL Ratio: 4 RATIO
Triglycerides: 89 mg/dL (ref <150)
VLDL: 18 mg/dL (ref 0–40)

## 2015-09-02 LAB — CBC
HCT: 32.2 % — ABNORMAL LOW (ref 39.0–52.0)
Hemoglobin: 10.8 g/dL — ABNORMAL LOW (ref 13.0–17.0)
MCH: 30.9 pg (ref 26.0–34.0)
MCHC: 33.5 g/dL (ref 30.0–36.0)
MCV: 92 fL (ref 78.0–100.0)
PLATELETS: 299 10*3/uL (ref 150–400)
RBC: 3.5 MIL/uL — ABNORMAL LOW (ref 4.22–5.81)
RDW: 13.5 % (ref 11.5–15.5)
WBC: 8.2 10*3/uL (ref 4.0–10.5)

## 2015-09-02 LAB — URINE MICROSCOPIC-ADD ON: SQUAMOUS EPITHELIAL / LPF: NONE SEEN

## 2015-09-02 LAB — LACTIC ACID, PLASMA
LACTIC ACID, VENOUS: 0.8 mmol/L (ref 0.5–2.0)
LACTIC ACID, VENOUS: 0.9 mmol/L (ref 0.5–2.0)

## 2015-09-02 LAB — GLUCOSE, CAPILLARY
GLUCOSE-CAPILLARY: 134 mg/dL — AB (ref 65–99)
Glucose-Capillary: 112 mg/dL — ABNORMAL HIGH (ref 65–99)
Glucose-Capillary: 119 mg/dL — ABNORMAL HIGH (ref 65–99)

## 2015-09-02 LAB — APTT: APTT: 28 s (ref 24–37)

## 2015-09-02 LAB — PROTIME-INR
INR: 1.13 (ref 0.00–1.49)
PROTHROMBIN TIME: 14.7 s (ref 11.6–15.2)

## 2015-09-02 LAB — MRSA PCR SCREENING: MRSA BY PCR: NEGATIVE

## 2015-09-02 LAB — I-STAT TROPONIN, ED: TROPONIN I, POC: 0 ng/mL (ref 0.00–0.08)

## 2015-09-02 LAB — INFLUENZA PANEL BY PCR (TYPE A & B)
H1N1FLUPCR: NOT DETECTED
INFLAPCR: NEGATIVE
Influenza B By PCR: NEGATIVE

## 2015-09-02 LAB — PROCALCITONIN

## 2015-09-02 MED ORDER — LORAZEPAM 2 MG/ML IJ SOLN
0.5000 mg | INTRAMUSCULAR | Status: DC | PRN
  Administered 2015-09-03: 0.5 mg via INTRAVENOUS
  Filled 2015-09-02: qty 1

## 2015-09-02 MED ORDER — SODIUM CHLORIDE 0.9 % IV BOLUS (SEPSIS)
1000.0000 mL | Freq: Once | INTRAVENOUS | Status: AC
  Administered 2015-09-02: 1000 mL via INTRAVENOUS

## 2015-09-02 MED ORDER — HYDRALAZINE HCL 20 MG/ML IJ SOLN
10.0000 mg | INTRAMUSCULAR | Status: DC | PRN
Start: 2015-09-02 — End: 2015-09-05
  Administered 2015-09-02 – 2015-09-05 (×4): 10 mg via INTRAVENOUS
  Filled 2015-09-02 (×4): qty 1

## 2015-09-02 MED ORDER — MORPHINE SULFATE (PF) 2 MG/ML IV SOLN
1.0000 mg | INTRAVENOUS | Status: DC | PRN
  Administered 2015-09-04: 1 mg via INTRAVENOUS
  Filled 2015-09-02 (×2): qty 1

## 2015-09-02 MED ORDER — ONDANSETRON HCL 4 MG PO TABS: 4.0000 mg | ORAL_TABLET | Freq: Four times a day (QID) | ORAL | Status: DC | PRN

## 2015-09-02 MED ORDER — ACETAMINOPHEN 650 MG RE SUPP
650.0000 mg | Freq: Four times a day (QID) | RECTAL | Status: DC | PRN
  Administered 2015-09-02: 650 mg via RECTAL
  Filled 2015-09-02: qty 1

## 2015-09-02 MED ORDER — HYDRALAZINE HCL 20 MG/ML IJ SOLN: 5.0000 mg | INTRAMUSCULAR | Status: DC | PRN

## 2015-09-02 MED ORDER — SODIUM CHLORIDE 0.9 % IJ SOLN
3.0000 mL | Freq: Two times a day (BID) | INTRAMUSCULAR | Status: DC
  Administered 2015-09-03 – 2015-09-06 (×4): 3 mL via INTRAVENOUS

## 2015-09-02 MED ORDER — ALBUTEROL SULFATE (2.5 MG/3ML) 0.083% IN NEBU: 2.5000 mg | INHALATION_SOLUTION | RESPIRATORY_TRACT | Status: DC | PRN

## 2015-09-02 MED ORDER — ACETAMINOPHEN 325 MG PO TABS: 650.0000 mg | ORAL_TABLET | Freq: Four times a day (QID) | ORAL | Status: DC | PRN

## 2015-09-02 MED ORDER — PIPERACILLIN-TAZOBACTAM 3.375 G IVPB: 3.3750 g | Freq: Three times a day (TID) | INTRAVENOUS | Status: DC

## 2015-09-02 MED ORDER — INSULIN ASPART 100 UNIT/ML ~~LOC~~ SOLN
0.0000 [IU] | Freq: Three times a day (TID) | SUBCUTANEOUS | Status: DC
  Administered 2015-09-02: 1 [IU] via SUBCUTANEOUS

## 2015-09-02 MED ORDER — ONDANSETRON HCL 4 MG/2ML IJ SOLN: 4.0000 mg | Freq: Four times a day (QID) | INTRAMUSCULAR | Status: DC | PRN

## 2015-09-02 MED ORDER — SODIUM CHLORIDE 0.9 % IV SOLN: 1000.0000 mL | INTRAVENOUS | Status: DC

## 2015-09-02 NOTE — ED Notes (Addendum)
Lab called for influenza swab, Admitting MD at bedside

## 2015-09-02 NOTE — ED Notes (Signed)
Pt placed on droplet precaustions.

## 2015-09-02 NOTE — H&P (Signed)
[facility] History and Physical  Raymond Ferrell ZOX:[id number] DOB: 09-10-1941 DOA: 09/01/2015  Referring physician: ED physician PCP: Darrow Bussing, MD  Specialists:   Chief Complaint: AMS and fever  HPI: Raymond Ferrell is a 74 y.o. male with PMH of Parkinson's Lewy body disease, difficulty speaking, gait instability, hypertension, hyperlipidemia, diabetes mellitus, depression, anxiety, dementia, who presents with altered mental status and fever.  Per his daughter, pt is a resident at [facility], staff states pt woke up at [facility] and has been less responsive today. He is very confused. He dose not seem have pain. Has mild dry cough. He moves all extremities. He has fever. No diarrhea, hematochezia, hematuria or vomiting. No rashes.  In ED, patient was found to have very stiff neck, elevated bp at 221/183-->165/97, negative urinalysis, negative troponin, corrected 0.83, WBC 9.7, temperature 100.4, no tachycardia, no tachypnea, electrolytes and renal function okay. Chest x-ray is negative for acute abnormalities. CT-head showed large left subdural hematoma with left-to-right midline shift and with moderate mass effect and basal cisterns remain patent. Patient's and admitted to inpatient for further evaluated and treatment. Neurosurgeon was consulted by ED.  Where does patient live?  assistant living facility   Can patient participate in ADLs?   None    Review of Systems: could not be reviewed due to altered mental status.  Allergy: No Known Allergies  Past Medical History  Diagnosis Date  . Depression   . Diabetes mellitus without complication (HCC)   . Anxiety   . Hypertension   . Hyperlipidemia   . Gait abnormality   . Speech problem   . Parkinson's disease, Lewy body [facility])     Past Surgical History  Procedure Laterality Date  . None      Social History:  reports that he has never smoked. He has never used smokeless tobacco. He reports that he does not  drink alcohol or use illicit drugs.  Family History:  Family History  Problem Relation Age of Onset  . Cancer Mother   . Cancer Sister      Prior to Admission medications   Medication Sig Start Date End Date Taking? Authorizing Provider  divalproex (DEPAKOTE) 125 MG DR tablet Take 125 mg by mouth daily with breakfast.     Historical Provider, MD  donepezil (ARICEPT) 10 MG tablet Take 1 tablet (10 mg total) by mouth at bedtime. Patient not taking: Reported on 07/17/2015 03/06/15   Levert Feinstein, MD  lisinopril-hydrochlorothiazide (PRINZIDE,ZESTORETIC) 20-25 MG per tablet Take 1 tablet by mouth daily with breakfast.     Historical Provider, MD  LORazepam (ATIVAN) 0.5 MG tablet Take 0.5 mg by mouth every 12 (twelve) hours as needed for anxiety (and agitation).    Historical Provider, MD  memantine (NAMENDA) 10 MG tablet Take 1 tablet (10 mg total) by mouth 2 (two) times daily. Patient not taking: Reported on 07/17/2015 12/18/14   Levert Feinstein, MD  metFORMIN (GLUCOPHAGE-XR) 500 MG 24 hr tablet Take 500 mg by mouth 2 (two) times daily.    Historical Provider, MD  QUEtiapine (SEROQUEL) 25 MG tablet One to two tabs po qhs Patient not taking: Reported on 07/17/2015 05/22/15   Levert Feinstein, MD  QUEtiapine (SEROQUEL) 50 MG tablet Take 50 mg by mouth at bedtime.    Historical Provider, MD    Physical Exam: Filed Vitals:   09/02/15 0300 09/02/15 0332 09/02/15 0507 09/02/15 0536  BP: 177/79  181/83 138/79  Pulse:   71   Temp:  99.9 F (37.7 C)   TempSrc:   Oral   Resp: 20 14    SpO2:   99%    General: Not in acute distress HEENT:       Eyes: PERRL, EOMI, no scleral icterus.       ENT: No discharge from the ears and nose, no pharynx injection, no tonsillar enlargement.        Neck: No JVD, no bruit, no mass felt. Heme: No neck lymph node enlargement. Cardiac: S1/S2, RRR, No murmurs, No gallops or rubs. Pulm: No rales, wheezing or rubs. Has rhonchi bilaterally. Abd: Soft, nondistended, nontender, no  rebound pain, no organomegaly, BS present. Ext: No pitting leg edema bilaterally. 2+DP/PT pulse bilaterally. Musculoskeletal: No joint deformities, No joint redness or warmth, no limitation of ROM in spin. Skin: No rashes.  Neuro: disoriented X3, cranial nerves II-XII grossly intact, moves all extremities upon painful stimulus. Negative Babinski's sign. Very stiff neck.  Psych: Patient is not psychotic, no suicidal or hemocidal ideation.  Labs on Admission:  Basic Metabolic Panel:  Recent Labs Lab 09/01/15 2303  NA 138  K 4.2  CL 103  CO2 25  GLUCOSE 143*  BUN 19  CREATININE 1.12  CALCIUM 9.5   Liver Function Tests:  Recent Labs Lab 09/01/15 2303  AST 19  ALT 25  ALKPHOS 109  BILITOT 1.1  PROT 7.5  ALBUMIN 4.5   No results for input(s): LIPASE, AMYLASE in the last 168 hours. No results for input(s): AMMONIA in the last 168 hours. CBC:  Recent Labs Lab 09/01/15 2303  WBC 9.7  NEUTROABS 7.7  HGB 12.1*  HCT 36.3*  MCV 90.5  PLT 347   Cardiac Enzymes: No results for input(s): CKTOTAL, CKMB, CKMBINDEX, TROPONINI in the last 168 hours.  BNP (last 3 results) No results for input(s): BNP in the last 8760 hours.  ProBNP (last 3 results) No results for input(s): PROBNP in the last 8760 hours.  CBG: No results for input(s): GLUCAP in the last 168 hours.  Radiological Exams on Admission: Ct Head Wo Contrast  09/02/2015  CLINICAL DATA:  Altered mental status EXAM: CT HEAD WITHOUT CONTRAST TECHNIQUE: Contiguous axial images were obtained from the base of the skull through the vertex without intravenous contrast. COMPARISON:  07/17/2015 FINDINGS: There is a large left subdural hematoma, covering essentially the entire left cerebral hemisphere. Depth is approximately 7 mm. The subdural continues on to the left tentorium. There is 6 mm left-to-right midline shift. There is moderate effacement of the left lateral ventricle. There is marked sulcal effacement in the left  cerebral hemisphere. There is moderate generalized atrophy. Basal cisterns remain patent. There is marked hemispheric white matter hypodensity suggesting chronic small vessel ischemic disease. No intraventricular or intraparenchymal hemorrhage is evident. No bony abnormality is evident. IMPRESSION: Large left subdural hematoma with left-to-right midline shift and with moderate mass effect. Basal cisterns remain patent. Critical Value/emergent results were called by telephone at the time of interpretation on 09/02/2015 at 3:36 am to Dr. Glendora ScoreJohn Ferrell, who verbally acknowledged these results. Electronically Signed   By: Ellery Plunkaniel R Mitchell M.D.   On: 09/02/2015 03:40   Dg Chest Port 1 View  09/01/2015  CLINICAL DATA:  Altered mental status. EXAM: PORTABLE CHEST 1 VIEW COMPARISON:  None. FINDINGS: A single AP portable view of the chest demonstrates no focal airspace consolidation or alveolar edema. The lungs are grossly clear. There is no large effusion or pneumothorax. Cardiac and mediastinal contours appear unremarkable. IMPRESSION: No active  disease. Electronically Signed   By: Ellery Plunk M.D.   On: 09/01/2015 23:16    EKG:  Not done in ED, will get one.   Assessment/Plan Principal Problem:   SDH (subdural hematoma) (HCC) Active Problems:   Speech problem   Abnormality of gait   Acute encephalopathy   Fever   Essential hypertension   Diabetes mellitus without complication (HCC)   Parkinson's disease, Lewy body (HCC)   Dementia  Acute encephalopath due to SHD: CT-head showed large left subdural hematoma with left-to-right midline shift and with moderate mass effect and basal cisterns remain patent. EDP and I had extensive conversations with family, and revealed that they do not feel that pt is a surgical candidate and do not wish to undergo surgery. EDP discussed with neurosurgeon, Dr. Bevely Palmer, on-call for neurosurgery. He did agree that the patient likely is a poor surgical candidate and  therefore should not undergo intervention for this. Dr. Bevely Palmer did not feel that the patient required transfer to [facility].  He felt the patient could be monitored in [facility]. Family is aware of the poor prognosis. They want to give pt 1 or 2 days before starting comfort care.  -will admit to SUD -frequent neuro checks -NPO -INR/PTT -IV Hydralazine for bp control  Fever: etiology is not clear, but most likely due to SDH. No leukocytosis. Has negative urinalysis and negative chest x-ray. Initially patient was started with vancomycin and Zosyn empirically, which will be discontinued. -follow up Bx and Ux  HTN: -IV hydralazine when necessary  DM-II: Last A1c not on record. Patient is taking metforminat home -SSI -Check A1c  Hx of Parkinson's-Lewy body disease: No on medications. -pt/ ot  Depression and anxiety:  -Hold home oral medications -prn Ativan   DVT ppx: SCD Code Status: DNR Family Communication: Yes, patient's daughter and a granddaughter at bed side Disposition Plan: Admit to inpatient   Date of Service 09/02/2015    Lorretta Harp [facility] Pager [phone]  If 7PM-7AM, please contact night-coverage [email or web address] Password [email or web address] 09/02/2015, 6:07 AM

## 2015-09-02 NOTE — Progress Notes (Signed)
Utilization review completed.  

## 2015-09-02 NOTE — Progress Notes (Signed)
Patient moans and groans with stimulation.  No understandable words.  Pupils a equal and react to light, but deviate to the left.  Moving arms and legs spontaneously, not to commands.  Dr. Gonzella Lexhungel notified and changes.  Patient has snoring respirations.   BP elevated.  Patient taken to CT after obtaining the order and then returned to room.  Patient continues to snore, and not respond to questions or commands.  Family at bedside and updated family.  Continue to monitor patient.  Ulice Follett Debroah LoopArnold RN

## 2015-09-02 NOTE — ED Notes (Signed)
Son Normand SloopDillard [phone]  Daughter Rosey Batheresa [phone] cell and home [phone]

## 2015-09-02 NOTE — Progress Notes (Signed)
[facility] PROGRESS NOTE  Raymond Ferrell:[id number] DOB: Mar 20, 1941 DOA: 09/01/2015 PCP: Raymond Bussing, MD  Assessment/Plan: Large subdural hematoma with acute encephalopathy Has left-to-right midline shift with moderate mass effect. Neurosurgery consulted from the ED and recommend patient is a poor candidate for surgery. Continue conservative management. Likely has a poor prognosis and family aware. -Continue neuro checks. Repeat head CT later this evening. -Blood pressure controlled with when necessary IV hydralazine. -Patient reportedly very lethargic on presentation but is awake and responding to meetings and able to tell his name. As per daughter this is much improved since admission. At baseline is very demented. -If deteriorates further, goal is for comfort  Fever Unclear etiology. Possibly associated with subdural hematoma. UA and chest x-ray unremarkable. Check flu PCR. Follow cultures.  Uncontrolled blood pressure On when necessary IV hydralazine.  Type 2 diabetes mellitus Take metformin at home. Monitor on sliding scale coverage.  Depression and anxiety Held home medications. Getting when necessary Ativan.  Parkinson's dementia Resume his Aricept   Diet: Dysphagia level I  DVT prophylaxis: SCDs  Code Status: DO NOT RESUSCITATE, guarded prognosis Family Communication: Spoke with daughter Raymond Ferrell on the phone  Disposition Plan: Continue step down monitoring   Consultants:  None (ED physician consulted Dr. Bevely Ferrell on the phone)  Procedures:  Head CT  Antibiotics:  IV vancomycin and Zosyn 1  HPI/Subjective: Seen and examined. Admission H&P reviewed. Patient is awake and responding to commands but very confused  Objective: Filed Vitals:   09/02/15 0900 09/02/15 1000  BP: 138/59 189/77  Pulse: 72 92  Temp:    Resp: 5 15    Intake/Output Summary (Last 24 hours) at 09/02/15 1052 Last data filed at 09/02/15 1000  Gross per 24 hour  Intake    1950 ml  Output      0 ml  Net   1950 ml   Filed Weights   09/02/15 0600  Weight: 72.1 kg (158 lb 15.2 oz)    Exam:   General:  Elderly male lying in bed appears little restless, awake  HEENT: No pallor, moist oral mucosa  Chest: Clear to auscultation bilaterally  CVS: S1 and S2, no murmurs rub or gallop  GI: Soft, nondistended, nontender, bowel sounds present  Musculoskeletal:: Warm, no edema, moving all extremities  CNS: Awake, alert, has slow slurred speech, ble to tell his name and greet in response, moving all extremities.    Data Reviewed: Basic Metabolic Panel:  Recent Labs Lab 09/01/15 2303 09/02/15 0720  NA 138 139  K 4.2 3.8  CL 103 106  CO2 25 24  GLUCOSE 143* 134*  BUN 19 17  CREATININE 1.12 1.02  CALCIUM 9.5 9.0   Liver Function Tests:  Recent Labs Lab 09/01/15 2303  AST 19  ALT 25  ALKPHOS 109  BILITOT 1.1  PROT 7.5  ALBUMIN 4.5   No results for input(s): LIPASE, AMYLASE in the last 168 hours. No results for input(s): AMMONIA in the last 168 hours. CBC:  Recent Labs Lab 09/01/15 2303 09/02/15 0720  WBC 9.7 8.2  NEUTROABS 7.7  --   HGB 12.1* 10.8*  HCT 36.3* 32.2*  MCV 90.5 92.0  PLT 347 299   Cardiac Enzymes: No results for input(s): CKTOTAL, CKMB, CKMBINDEX, TROPONINI in the last 168 hours. BNP (last 3 results) No results for input(s): BNP in the last 8760 hours.  ProBNP (last 3 results) No results for input(s): PROBNP in the last 8760 hours.  CBG:  Recent Labs Lab  09/02/15 0737  GLUCAP 134*    Recent Results (from the past 240 hour(s))  MRSA PCR Screening     Status: None   Collection Time: 09/02/15  5:55 AM  Result Value Ref Range Status   MRSA by PCR NEGATIVE NEGATIVE Final    Comment:        The GeneXpert MRSA Assay (FDA approved for NASAL specimens only), is one component of a comprehensive MRSA colonization surveillance program. It is not intended to diagnose MRSA infection nor to guide or monitor  treatment for MRSA infections.      Studies: Ct Head Wo Contrast  09/02/2015  CLINICAL DATA:  Altered mental status EXAM: CT HEAD WITHOUT CONTRAST TECHNIQUE: Contiguous axial images were obtained from the base of the skull through the vertex without intravenous contrast. COMPARISON:  07/17/2015 FINDINGS: There is a large left subdural hematoma, covering essentially the entire left cerebral hemisphere. Depth is approximately 7 mm. The subdural continues on to the left tentorium. There is 6 mm left-to-right midline shift. There is moderate effacement of the left lateral ventricle. There is marked sulcal effacement in the left cerebral hemisphere. There is moderate generalized atrophy. Basal cisterns remain patent. There is marked hemispheric white matter hypodensity suggesting chronic small vessel ischemic disease. No intraventricular or intraparenchymal hemorrhage is evident. No bony abnormality is evident. IMPRESSION: Large left subdural hematoma with left-to-right midline shift and with moderate mass effect. Basal cisterns remain patent. Critical Value/emergent results were called by telephone at the time of interpretation on 09/02/2015 at 3:36 am to Raymond Ferrell, who verbally acknowledged these results. Electronically Signed   By: Raymond Ferrell M.D.   On: 09/02/2015 03:40   Dg Chest Port 1 View  09/01/2015  CLINICAL DATA:  Altered mental status. EXAM: PORTABLE CHEST 1 VIEW COMPARISON:  None. FINDINGS: A single AP portable view of the chest demonstrates no focal airspace consolidation or alveolar edema. The lungs are grossly clear. There is no large effusion or pneumothorax. Cardiac and mediastinal contours appear unremarkable. IMPRESSION: No active disease. Electronically Signed   By: Raymond Ferrell M.D.   On: 09/01/2015 23:16    Scheduled Meds: . insulin aspart  0-9 Units Subcutaneous TID WC  . sodium chloride  3 mL Intravenous Q12H   Continuous Infusions: . sodium chloride 1,000 mL  (09/02/15 0900)       Time spent: 20 minutes    Glenora Morocho  [facility] Pager [phone]. If 7PM-7AM, please contact night-coverage at [email or web address], password [email or web address][email or web address] 09/02/2015, 10:52 AM  LOS: 0 days

## 2015-09-02 NOTE — ED Notes (Signed)
Admitting physician questions about droplet precautions and yes he wants it continued until further notice or cleared by testing.

## 2015-09-02 NOTE — Progress Notes (Signed)
PT Cancellation Note  Patient Details Name: Raymond Ferrell MRN: [id number] DOB: 09/21/1941   Cancelled Treatment:    Reason Eval/Treat Not Completed: PT screened, no needs identified, will sign off;Other (comment); It is noted in MD progress note that family plans are likely comfort care after giving pt 1-2days to see how he does, also pt is on strict bedrest and his BP continues to be significantly elevated, not a candidate for surgery per neuro; Please re-order if needs should arise; Thank you.   [facility] Endoscopy Center IncWILLIAMS,Raymond Ferrell, 9:47 AM

## 2015-09-02 NOTE — Progress Notes (Signed)
OT Cancellation Note  Patient Details Name: Karma GreaserDillard W Proto MRN: [id number] DOB: 1940-10-14   Cancelled Treatment:    Reason Eval/Treat Not Completed: Medical issues which prohibited therapy.  Pt with poor prognosis--please reorder if pt's medical status improves and he may benefit from OT.  Jakira Mcfadden 09/02/2015, 11:24 AM  Marica OtterMaryellen Kaidance Pantoja, OTR/L [phone] 09/02/2015

## 2015-09-02 NOTE — Progress Notes (Signed)
Patient lethargic and non communicative.moaning and snoring. Pupils reactive but with left lateral deviation. vitals table except for elevated BP. Moans to painful stimuli only.  Made NPO Stat head CT repeated showing stable extensive left subdural hematoma without new progression.  d/w daughter again regarding his poor prognosis. She agrees to make him full comfort.  continue fluids for now. Will add prn low dose morphine for SOB/ comfort. Discussed options for residential hospice for tomorrow if survives hospitalization and stable.  Keep in SDU tonight. Condition is guarded.

## 2015-09-03 LAB — GLUCOSE, CAPILLARY
GLUCOSE-CAPILLARY: 143 mg/dL — AB (ref 65–99)
GLUCOSE-CAPILLARY: 125 mg/dL — AB (ref 65–99)

## 2015-09-03 LAB — URINE CULTURE: CULTURE: NO GROWTH

## 2015-09-03 LAB — HEMOGLOBIN A1C
HEMOGLOBIN A1C: 5.5 % (ref 4.8–5.6)
Mean Plasma Glucose: 111 mg/dL

## 2015-09-03 MED ORDER — SODIUM CHLORIDE 0.9 % IV SOLN
1000.0000 mL | INTRAVENOUS | Status: DC
  Administered 2015-09-05: 1000 mL via INTRAVENOUS

## 2015-09-03 NOTE — Progress Notes (Signed)
[facility] PROGRESS NOTE  Raymond Ferrell ZOX:[id number] DOB: 08/13/1941 DOA: 09/01/2015 PCP: Darrow Bussing, MD  Assessment/Plan: Large subdural hematoma with acute encephalopathy Has left-to-right midline shift with moderate mass effect. Neurosurgery consulted from the ED and recommend patient is a poor candidate for surgery.  -Getting conservative management. Repeat head CT done showing unchanged large subdural hematoma. Discussed extensively with patient's daughter and she agrees with keeping him comfortable and no aggressive measures. -Blood pressure controlled with when necessary IV hydralazine. -Patient having waxing and waning mental status.  -Goal is for comfort. Patient may expire while in the hospital. If condition unchanged oral deteriorated further by tomorrow will plan on residential hospice.  Fever Unclear etiology. Possibly associated with subdural hematoma. UA and chest x-ray unremarkable. Flu PCR negative. Follow cultures.   Uncontrolled blood pressure On when necessary IV hydralazine.  Type 2 diabetes mellitus Take metformin at home. Discontinued CBG monitoring and sliding skill coverage.   Depression and anxiety Held home medications. Getting when necessary Ativan.  Parkinson's dementia Patient off medications.   Diet: Dysphagia level I  DVT prophylaxis: SCDs  Code Status: DO NOT RESUSCITATE, guarded prognosis Family Communication: Spoke with daughter at bedside disposition Plan:  transfer to medical floor.  consultants:  None (ED physician consulted Dr. Bevely Palmer on the phone)  Procedures:  Head CT  Antibiotics:  IV vancomycin and Zosyn 1  HPI/Subjective: Seen and examined.  was very lethargic yesterday and made full comfort. Repeat head CT unchanged with persistent large subdural hematoma. Overnight vitals have been stable except for occasional elevated blood pressure. This morning is awake but poorly communicative.   Objective: Filed  Vitals:   09/03/15 0815 09/03/15 1027  BP:  159/70  Pulse:  81  Temp: 98 F (36.7 C) 98.7 F (37.1 C)  Resp:  16    Intake/Output Summary (Last 24 hours) at 09/03/15 1459 Last data filed at 09/03/15 1327  Gross per 24 hour  Intake   1900 ml  Output   1950 ml  Net    -50 ml   Filed Weights   09/02/15 0600 09/03/15 0400  Weight: 72.1 kg (158 lb 15.2 oz) 74.4 kg (164 lb 0.4 oz)    Exam:   General:  Elderly male lying in bed  lethargic but arousable to commands and speaks a few words  HEENT: No pallor, moist oral mucosa  Chest: Clear to auscultation bilaterally  CVS: S1 and S2, no murmurs rub or gallop  GI: Soft, nondistended, nontender, bowel sounds present  Musculoskeletal:: Warm, no edema, moving all extremities  CNS: Awake, alert, has slow slurred speech, able to see a few words, moving his extremities.    Data Reviewed: Basic Metabolic Panel:  Recent Labs Lab 09/01/15 2303 09/02/15 0720  NA 138 139  K 4.2 3.8  CL 103 106  CO2 25 24  GLUCOSE 143* 134*  BUN 19 17  CREATININE 1.12 1.02  CALCIUM 9.5 9.0   Liver Function Tests:  Recent Labs Lab 09/01/15 2303  AST 19  ALT 25  ALKPHOS 109  BILITOT 1.1  PROT 7.5  ALBUMIN 4.5   No results for input(s): LIPASE, AMYLASE in the last 168 hours. No results for input(s): AMMONIA in the last 168 hours. CBC:  Recent Labs Lab 09/01/15 2303 09/02/15 0720  WBC 9.7 8.2  NEUTROABS 7.7  --   HGB 12.1* 10.8*  HCT 36.3* 32.2*  MCV 90.5 92.0  PLT 347 299   Cardiac Enzymes: No results for input(s): CKTOTAL, CKMB,  CKMBINDEX, TROPONINI in the last 168 hours. BNP (last 3 results) No results for input(s): BNP in the last 8760 hours.  ProBNP (last 3 results) No results for input(s): PROBNP in the last 8760 hours.  CBG:  Recent Labs Lab 09/02/15 0737 09/02/15 1141 09/02/15 1651 09/02/15 2328 09/03/15 0805  GLUCAP 134* 112* 119* 143* 125*    Recent Results (from the past 240 hour(s))  Blood  Culture (routine x 2)     Status: None (Preliminary result)   Collection Time: 09/01/15 11:00 PM  Result Value Ref Range Status   Specimen Description BLOOD RIGHT FOREARM  Final   Special Requests BOTTLES DRAWN AEROBIC AND ANAEROBIC 5CC  Final   Culture   Final    NO GROWTH 1 DAY Performed at [facility]    Report Status PENDING  Incomplete  Blood Culture (routine x 2)     Status: None (Preliminary result)   Collection Time: 09/01/15 11:03 PM  Result Value Ref Range Status   Specimen Description BLOOD LEFT ARM  Final   Special Requests BOTTLES DRAWN AEROBIC AND ANAEROBIC 5CC  Final   Culture   Final    NO GROWTH 1 DAY Performed at [facility]    Report Status PENDING  Incomplete  Urine culture     Status: None   Collection Time: 09/02/15  1:03 AM  Result Value Ref Range Status   Specimen Description URINE, CATHETERIZED  Final   Special Requests NONE  Final   Culture   Final    NO GROWTH 1 DAY Performed at [facility]    Report Status 09/03/2015 FINAL  Final  MRSA PCR Screening     Status: None   Collection Time: 09/02/15  5:55 AM  Result Value Ref Range Status   MRSA by PCR NEGATIVE NEGATIVE Final    Comment:        The GeneXpert MRSA Assay (FDA approved for NASAL specimens only), is one component of a comprehensive MRSA colonization surveillance program. It is not intended to diagnose MRSA infection nor to guide or monitor treatment for MRSA infections.      Studies: Ct Head Wo Contrast  09/02/2015  CLINICAL DATA:  Followup subdural hematoma. EXAM: CT HEAD WITHOUT CONTRAST TECHNIQUE: Contiguous axial images were obtained from the base of the skull through the vertex without intravenous contrast. COMPARISON:  Earlier CT scan, same date. FINDINGS: Stable fairly extensive left-sided subdural hematoma coming up over the left convexity. Stable sub false Ing herniation estimated at 6 mm. Stable compression of the left parietal sulci. The gray-white  differentiation is maintained. No findings for downward transtentorial herniation. The CSF spaces are maintained. IMPRESSION: Stable fairly extensive left-sided subdural hematoma. No new or progressive findings are identified. Electronically Signed   By: Rudie Meyer M.D.   On: 09/02/2015 17:23   Ct Head Wo Contrast  09/02/2015  CLINICAL DATA:  Altered mental status EXAM: CT HEAD WITHOUT CONTRAST TECHNIQUE: Contiguous axial images were obtained from the base of the skull through the vertex without intravenous contrast. COMPARISON:  07/17/2015 FINDINGS: There is a large left subdural hematoma, covering essentially the entire left cerebral hemisphere. Depth is approximately 7 mm. The subdural continues on to the left tentorium. There is 6 mm left-to-right midline shift. There is moderate effacement of the left lateral ventricle. There is marked sulcal effacement in the left cerebral hemisphere. There is moderate generalized atrophy. Basal cisterns remain patent. There is marked hemispheric white matter hypodensity suggesting chronic small  vessel ischemic disease. No intraventricular or intraparenchymal hemorrhage is evident. No bony abnormality is evident. IMPRESSION: Large left subdural hematoma with left-to-right midline shift and with moderate mass effect. Basal cisterns remain patent. Critical Value/emergent results were called by telephone at the time of interpretation on 09/02/2015 at 3:36 am to Dr. Glendora ScoreJohn Pollina, who verbally acknowledged these results. Electronically Signed   By: Ellery Plunkaniel R Mitchell M.D.   On: 09/02/2015 03:40   Dg Chest Port 1 View  09/01/2015  CLINICAL DATA:  Altered mental status. EXAM: PORTABLE CHEST 1 VIEW COMPARISON:  None. FINDINGS: A single AP portable view of the chest demonstrates no focal airspace consolidation or alveolar edema. The lungs are grossly clear. There is no large effusion or pneumothorax. Cardiac and mediastinal contours appear unremarkable. IMPRESSION: No active  disease. Electronically Signed   By: Ellery Plunkaniel R Mitchell M.D.   On: 09/01/2015 23:16    Scheduled Meds: . sodium chloride  3 mL Intravenous Q12H   Continuous Infusions: . sodium chloride 1,000 mL (09/02/15 0900)       Time spent: 25 minutes    Dakari Stabler  [facility] Pager [phone]. If 7PM-7AM, please contact night-coverage at [email or web address], password [email or web address] 09/03/2015, 2:59 PM  LOS: 1 day

## 2015-09-03 NOTE — Progress Notes (Signed)
eLink Physician-Brief Progress Note Patient Name: Karma GreaserDillard W Mcclard DOB: 1941/08/01 MRN: [id number]   Date of Service  09/03/2015  HPI/Events of Note  Chart reviewed Full comfort measures only  eICU Interventions  transfer out of ICU     Intervention Category Minor Interventions: Routine modifications to care plan (e.g. PRN medications for pain, fever)  Max FickleDouglas Rodrigues Urbanek 09/03/2015, 12:54 AM

## 2015-09-04 NOTE — Progress Notes (Signed)
[facility] PROGRESS NOTE  Raymond Ferrell:[id number] DOB: [birth year] DOA: 09/01/2015 PCP: Darrow Bussing, MD  74 year old male with Parkinson's dementia with slurred speech and gait instability, hypertension, hyperlipidemia, diabetes mellitus, anxiety, depression presented with altered mental status and fever. Patient is a resident of [facility] assisted [facility] and on the day of admission he was poorly responsive on waking up and very confused. In the ED patient had hypertensive urgency with blood pressure of 221/183 mmHg, fever 100.51F with normal blood work. Chest x-ray was unremarkable. CT head showed a large left subdural hematoma with left-to-right midline shift with moderate mass effect. Neurosurgeon was consulted who recommended patient was a poor candidate for surgery and conservative management. Patient admitted to hospitalist service.  Assessment/Plan: Large subdural hematoma with acute encephalopathy Has left-to-right midline shift with moderate mass effect. Neurosurgery consulted from the ED and recommend patient is a poor candidate for surgery.  - conservative management. Repeat head CT done showing unchanged large subdural hematoma. Discussed extensively with patient's daughter and she agrees with keeping him comfortable and no aggressive measures. -Blood pressure controlled with when necessary IV hydralazine. -Patient having waxing and waning mental status.  -Daughter agrees with going for comfort. Will consult palliative care to assist with symptom management and eventual disposition.  Fever Unclear etiology. Possibly associated with subdural hematoma. UA and chest x-ray unremarkable. Flu PCR negative. Follow cultures.   Uncontrolled blood pressure On when necessary IV hydralazine.  Type 2 diabetes mellitus Take metformin at home. Discontinued CBG monitoring and sliding skill coverage.   Depression and anxiety Held home medications. Getting when necessary  Ativan.  Parkinson's dementia Patient off medications.   Diet: Nothing by mouth  DVT prophylaxis: SCDs  Code Status: DO NOT RESUSCITATE, guarded prognosis Family Communication: Spoke with daughter at bedside disposition Plan:  transfer to medical floor.  consultants:  None (ED physician consulted Dr. Bevely Palmer on the phone)  Procedures:  Head CT  Antibiotics:  IV vancomycin and Zosyn 1  HPI/Subjective: Seen and examined.  Still has waxing and waning mental status. Blood pressure elevated.  Objective: Filed Vitals:   09/04/15 0544 09/04/15 1429  BP: 177/82 171/82  Pulse: 81 86  Temp: 98.6 F (37 C) 99.8 F (37.7 C)  Resp: 15 16    Intake/Output Summary (Last 24 hours) at 09/04/15 1502 Last data filed at 09/04/15 1024  Gross per 24 hour  Intake  737.5 ml  Output   2150 ml  Net -1412.5 ml   Filed Weights   09/02/15 0600 09/03/15 0400  Weight: 72.1 kg (158 lb 15.2 oz) 74.4 kg (164 lb 0.4 oz)    Exam:   General:  Elderly male lying in bed   arousable to commands and speaks a few words  HEENT: No pallor, moist oral mucosa  Chest: Clear to auscultation bilaterally  CVS: S1 and S2, no murmurs rub or gallop  GI: Soft, nondistended, nontender, bowel sounds present  Musculoskeletal:: Warm, no edema, moving all extremities  CNS: Awake, alert, has slow slurred speech, able to see a few words, moving his extremities.    Data Reviewed: Basic Metabolic Panel:  Recent Labs Lab 09/01/15 2303 09/02/15 0720  NA 138 139  K 4.2 3.8  CL 103 106  CO2 25 24  GLUCOSE 143* 134*  BUN 19 17  CREATININE 1.12 1.02  CALCIUM 9.5 9.0   Liver Function Tests:  Recent Labs Lab 09/01/15 2303  AST 19  ALT 25  ALKPHOS 109  BILITOT 1.1  PROT  7.5  ALBUMIN 4.5   No results for input(s): LIPASE, AMYLASE in the last 168 hours. No results for input(s): AMMONIA in the last 168 hours. CBC:  Recent Labs Lab 09/01/15 2303 09/02/15 0720  WBC 9.7 8.2  NEUTROABS  7.7  --   HGB 12.1* 10.8*  HCT 36.3* 32.2*  MCV 90.5 92.0  PLT 347 299   Cardiac Enzymes: No results for input(s): CKTOTAL, CKMB, CKMBINDEX, TROPONINI in the last 168 hours. BNP (last 3 results) No results for input(s): BNP in the last 8760 hours.  ProBNP (last 3 results) No results for input(s): PROBNP in the last 8760 hours.  CBG:  Recent Labs Lab 09/02/15 0737 09/02/15 1141 09/02/15 1651 09/02/15 2328 09/03/15 0805  GLUCAP 134* 112* 119* 143* 125*    Recent Results (from the past 240 hour(s))  Blood Culture (routine x 2)     Status: None (Preliminary result)   Collection Time: 09/01/15 11:00 PM  Result Value Ref Range Status   Specimen Description BLOOD RIGHT FOREARM  Final   Special Requests BOTTLES DRAWN AEROBIC AND ANAEROBIC 5CC  Final   Culture   Final    NO GROWTH 2 DAYS [facility]    Report Status PENDING  Incomplete  Blood Culture (routine x 2)     Status: None (Preliminary result)   Collection Time: 09/01/15 11:03 PM  Result Value Ref Range Status   Specimen Description BLOOD LEFT ARM  Final   Special Requests BOTTLES DRAWN AEROBIC AND ANAEROBIC 5CC  Final   Culture   Final    NO GROWTH 2 DAYS [facility]    Report Status PENDING  Incomplete  Urine culture     Status: None   Collection Time: 09/02/15  1:03 AM  Result Value Ref Range Status   Specimen Description URINE, CATHETERIZED  Final   Special Requests NONE  Final   Culture   Final    NO GROWTH 1 DAY [facility]    Report Status 09/03/2015 FINAL  Final  MRSA PCR Screening     Status: None   Collection Time: 09/02/15  5:55 AM  Result Value Ref Range Status   MRSA by PCR NEGATIVE NEGATIVE Final    Comment:        The GeneXpert MRSA Assay (FDA approved for NASAL specimens only), is one component of a comprehensive MRSA colonization surveillance program. It is not intended to diagnose MRSA infection nor to guide or monitor  treatment for MRSA infections.      Studies: Ct Head Wo Contrast  09/02/2015  CLINICAL DATA:  Followup subdural hematoma. EXAM: CT HEAD WITHOUT CONTRAST TECHNIQUE: Contiguous axial images were obtained from the base of the skull through the vertex without intravenous contrast. COMPARISON:  Earlier CT scan, same date. FINDINGS: Stable fairly extensive left-sided subdural hematoma coming up over the left convexity. Stable sub false Ing herniation estimated at 6 mm. Stable compression of the left parietal sulci. The gray-white differentiation is maintained. No findings for downward transtentorial herniation. The CSF spaces are maintained. IMPRESSION: Stable fairly extensive left-sided subdural hematoma. No new or progressive findings are identified. Electronically Signed   By: Rudie MeyerP.  Gallerani M.D.   On: 09/02/2015 17:23    Scheduled Meds: . sodium chloride  3 mL Intravenous Q12H   Continuous Infusions: . sodium chloride 1,000 mL (09/03/15 1515)       Time spent: 20 minutes    Merlin Golden  [facility]  Pager [phone]. If 7PM-7AM, please contact night-coverage at [email or web address], password [facility] 09/04/2015, 3:02 PM  LOS: 2 days

## 2015-09-04 NOTE — Progress Notes (Signed)
Nutrition Brief Note  Chart reviewed. MD states that goal is for comfort with no aggressive action and that if pt continues to deteriorate will plan for residential hospice. MD note also indicates that mental status wax and wanes.  No further nutrition interventions warranted at this time.  Please re-consult as needed.     Trenton GammonJessica Tannen Vandezande, RD, LDN Inpatient Clinical Dietitian Pager # [phone] After hours/weekend pager # [phone]

## 2015-09-05 DIAGNOSIS — Z789 Other specified health status: Secondary | ICD-10-CM

## 2015-09-05 MED ORDER — GLYCOPYRROLATE 1 MG PO TABS
1.0000 mg | ORAL_TABLET | ORAL | Status: DC | PRN
  Filled 2015-09-05: qty 1

## 2015-09-05 MED ORDER — GLYCOPYRROLATE 1 MG PO TABS: 1.0000 mg | ORAL_TABLET | ORAL | Status: AC | PRN

## 2015-09-05 MED ORDER — GLYCOPYRROLATE 0.2 MG/ML IJ SOLN
0.2000 mg | INTRAMUSCULAR | Status: DC | PRN
  Filled 2015-09-05: qty 1

## 2015-09-05 MED ORDER — HALOPERIDOL LACTATE 5 MG/ML IJ SOLN: 0.5000 mg | INTRAMUSCULAR | Status: DC | PRN

## 2015-09-05 MED ORDER — MORPHINE SULFATE (PF) 2 MG/ML IV SOLN: 1.0000 mg | INTRAVENOUS | Status: AC | PRN

## 2015-09-05 MED ORDER — LORAZEPAM 2 MG/ML IJ SOLN: 0.5000 mg | INTRAMUSCULAR | Status: AC | PRN

## 2015-09-05 MED ORDER — HALOPERIDOL 0.5 MG PO TABS
0.5000 mg | ORAL_TABLET | ORAL | Status: DC | PRN
  Filled 2015-09-05: qty 1

## 2015-09-05 MED ORDER — ONDANSETRON HCL 4 MG/2ML IJ SOLN: 4.0000 mg | Freq: Four times a day (QID) | INTRAMUSCULAR | Status: DC | PRN

## 2015-09-05 MED ORDER — BIOTENE DRY MOUTH MT LIQD: 15.0000 mL | OROMUCOSAL | Status: DC | PRN

## 2015-09-05 MED ORDER — POLYVINYL ALCOHOL 1.4 % OP SOLN
1.0000 [drp] | Freq: Four times a day (QID) | OPHTHALMIC | Status: DC | PRN
  Filled 2015-09-05: qty 15

## 2015-09-05 MED ORDER — ACETAMINOPHEN 650 MG RE SUPP: 650.0000 mg | Freq: Four times a day (QID) | RECTAL | Status: DC | PRN

## 2015-09-05 MED ORDER — MORPHINE SULFATE (PF) 2 MG/ML IV SOLN
1.0000 mg | INTRAVENOUS | Status: DC | PRN
  Administered 2015-09-06: 1 mg via INTRAVENOUS
  Filled 2015-09-05: qty 1

## 2015-09-05 MED ORDER — HALOPERIDOL LACTATE 2 MG/ML PO CONC
0.5000 mg | ORAL | Status: DC | PRN
  Filled 2015-09-05: qty 0.3

## 2015-09-05 MED ORDER — LORAZEPAM 2 MG/ML IJ SOLN
0.5000 mg | INTRAMUSCULAR | Status: DC | PRN
  Administered 2015-09-05: 0.5 mg via INTRAVENOUS
  Filled 2015-09-05: qty 1

## 2015-09-05 MED ORDER — ONDANSETRON 4 MG PO TBDP: 4.0000 mg | ORAL_TABLET | Freq: Four times a day (QID) | ORAL | Status: DC | PRN

## 2015-09-05 MED ORDER — GLYCOPYRROLATE 0.2 MG/ML IJ SOLN
0.2000 mg | INTRAMUSCULAR | Status: DC | PRN
  Administered 2015-09-05: 0.2 mg via INTRAVENOUS
  Filled 2015-09-05 (×3): qty 1

## 2015-09-05 MED ORDER — ACETAMINOPHEN 325 MG PO TABS: 650.0000 mg | ORAL_TABLET | Freq: Four times a day (QID) | ORAL | Status: DC | PRN

## 2015-09-05 NOTE — Consult Note (Signed)
Consultation Note Date: 09/05/2015   Patient Name: Raymond Ferrell  DOB: 05/23/1941  MRN: [id number]  Age / Sex: 74 y.o., male  PCP: Darrow Bussing, MD Referring Physician: Eddie North, MD  Reason for Consultation: Establishing goals of care and Inpatient hospice referral    Clinical Assessment/Narrative: 74 yo man with progressive Lewy Body Dementia, Parkinson's disease and diabetes who was admitted from [facility] where he was followed by [facility] after sustaining a serious fall. In ED he wwas found to have moderate to severe subdural hematoma and worsening mental status. Given patients poor functional status and progressive disease a decision was made to treat conservatively and evaluate for Hospice services.  He is NPO and bedbound for the past 5 days.  I spoke with his daughter Leanne Lovely- goals are for full comfort care.  Contacts/Participants in Discussion: Primary Decision Maker: [name]   Relationship to Patient Daughter HCPOA: yes  Also has a son  SUMMARY OF RECOMMENDATIONS  Code Status/Advance Care Planning: DNR    Code Status Orders        Start     Ordered   09/05/15 1231  Do not attempt resuscitation (DNR)   Continuous    Question Answer Comment  In the event of cardiac or respiratory ARREST Do not call a "code blue"   In the event of cardiac or respiratory ARREST Do not perform Intubation, CPR, defibrillation or ACLS   In the event of cardiac or respiratory ARREST Use medication by any route, position, wound care, and other measures to relive pain and suffering. May use oxygen, suction and manual treatment of airway obstruction as needed for comfort.      09/05/15 1236    Advance Directive Documentation        Most Recent Value   Type of Advance Directive  Out of facility DNR (pink MOST or yellow form)   Pre-existing out of facility DNR order (yellow form or pink  MOST form)     "MOST" Form in Place?        Other Directives:None  Symptom Management:   Anxiety/Agitation: Ativan IV PRN, will schedule low dose Ativan for seizure prophylaxis and muscle rigidity due to being off parkinson medications.  Pain: status post fall, requiring morphine PRNs  Palliative Prophylaxis:   Aspiration, Delirium Protocol, Frequent Pain Assessment, Oral Care and Turn Reposition  Additional Recommendations (Limitations, Scope, Preferences):  Full Comfort Care  Discontinuie diagnostics and lab monitoring  Psycho-social/Spiritual:  Support System: Strong Desire for further Chaplaincy support:yes Additional Recommendations: Caregiving  Support/Resources  Prognosis: < 2 weeks  Discharge Planning: Hospice facility- Specifically requested [facility]   Chief Complaint/ Primary Diagnoses: Present on Admission:  . Acute encephalopathy . Speech problem . Fever . Essential hypertension . Parkinson's disease, Lewy body (HCC) . Dementia . SDH (subdural hematoma) (HCC)  I have reviewed the medical record, interviewed the patient and family, and examined the patient. The following aspects are pertinent.  Past Medical History  Diagnosis Date  . Depression   . Diabetes mellitus without complication (HCC)   . Anxiety   . Hypertension   . Hyperlipidemia   . Gait abnormality   . Speech problem   . Parkinson's disease, Lewy body (HCC)    Social History   Social History  . Marital Status: Married    Spouse Name: N/A  . Number of Children: 2  . Years of Education: 12 th   Occupational History  .  retired   Social History Main Topics  . Smoking status: Never Smoker   . Smokeless tobacco: Never Used  . Alcohol Use: No  . Drug Use: No  . Sexual Activity: Not Asked   Other Topics Concern  . None   Social History Narrative   Patient Lives at home alone .   Retried  Corporate treasurer.   Education 12 th grade.   Right handed.   Caffeine one cup of  coffee daily.         Family History  Problem Relation Age of Onset  . Cancer Mother   . Cancer Sister    Scheduled Meds: . sodium chloride  3 mL Intravenous Q12H   Continuous Infusions:  PRN Meds:.acetaminophen **OR** acetaminophen, antiseptic oral rinse, glycopyrrolate **OR** glycopyrrolate **OR** glycopyrrolate, LORazepam, morphine injection, ondansetron **OR** ondansetron (ZOFRAN) IV, ondansetron **OR** ondansetron (ZOFRAN) IV, polyvinyl alcohol Medications Prior to Admission:  Prior to Admission medications   Medication Sig Start Date End Date Taking? Authorizing Provider  acetaminophen (TYLENOL) 500 MG tablet Take 500 mg by mouth every 8 (eight) hours as needed for moderate pain.   Yes Historical Provider, MD  LORazepam (ATIVAN) 0.5 MG tablet Take 0.5 mg by mouth every 12 (twelve) hours as needed for anxiety (and agitation).   Yes Historical Provider, MD  mirtazapine (REMERON) 15 MG tablet Take 15 mg by mouth at bedtime.   Yes Historical Provider, MD  QUEtiapine (SEROQUEL) 50 MG tablet Take 25 mg by mouth at bedtime.    Yes Historical Provider, MD   No Known Allergies  Review of Systems  Unable to perform ROS   Physical Exam  Vital Signs: BP 181/84 mmHg  Pulse 93  Temp(Src) 97.4 F (36.3 C) (Oral)  Resp 16  Ht  (1.727 m)  Wt 74.4 kg (164 lb 0.4 oz)  BMI 24.95 kg/m2  SpO2 97%  SpO2: SpO2: 97 % O2 Device:SpO2: 97 % O2 Flow Rate: .   IO: Intake/output summary:   Intake/Output Summary (Last 24 hours) at 09/05/15 1250 Last data filed at 09/05/15 1610  Gross per 24 hour  Intake   1000 ml  Output    650 ml  Net    350 ml    LBM: Last BM Date:  (UTEA) Baseline Weight: Weight: 72.1 kg (158 lb 15.2 oz) Most recent weight: Weight: 74.4 kg (164 lb 0.4 oz)      Palliative Assessment/Data:  Flowsheet Rows        Most Recent Value   Intake Tab    Referral Department  Hospitalist   Unit at Time of Referral  Med/Surg Unit   Palliative Care Primary Diagnosis   Neurology   Date Notified  09/04/15   Palliative Care Type  New Palliative care   Reason for referral  Clarify Goals of Care, Non-pain Symptom, Counsel Regarding Hospice   Date of Admission  09/01/15   Date first seen by Palliative Care  09/05/15   # of days IP prior to Palliative referral  3   Clinical Assessment    Palliative Performance Scale Score  20%   Pain Max last 24 hours  0   Pain Min Last 24 hours  0   Dyspnea Max Last 24 Hours  Not able to report   Dyspnea Min Last 24 hours  Not able to report   Nausea Max Last 24 Hours  Not able to report   Nausea Min Last 24 Hours  Not able to report   Anxiety  Max Last 24 Hours  5   Anxiety Min Last 24 Hours  0   Psychosocial & Spiritual Assessment    Social Work Plan of Care  Clarified patient/family wishes with healthcare team, Education on Hospice   Palliative Care Outcomes    Patient/Family meeting held?  Yes   Palliative Care Outcomes  Changed to focus on comfort, Provided end of life care assistance, Counseled regarding hospice, Transitioned to hospice   Palliative Care follow-up planned  No      Additional Data Reviewed:  CBC:    Component Value Date/Time   WBC 8.2 09/02/2015 0720   HGB 10.8* 09/02/2015 0720   HCT 32.2* 09/02/2015 0720   PLT 299 09/02/2015 0720   MCV 92.0 09/02/2015 0720   NEUTROABS 7.7 09/01/2015 2303   LYMPHSABS 1.2 09/01/2015 2303   MONOABS 0.7 09/01/2015 2303   EOSABS 0.0 09/01/2015 2303   BASOSABS 0.0 09/01/2015 2303   Comprehensive Metabolic Panel:    Component Value Date/Time   NA 139 09/02/2015 0720   K 3.8 09/02/2015 0720   CL 106 09/02/2015 0720   CO2 24 09/02/2015 0720   BUN 17 09/02/2015 0720   CREATININE 1.02 09/02/2015 0720   GLUCOSE 134* 09/02/2015 0720   CALCIUM 9.0 09/02/2015 0720   AST 19 09/01/2015 2303   ALT 25 09/01/2015 2303   ALKPHOS 109 09/01/2015 2303   BILITOT 1.1 09/01/2015 2303   PROT 7.5 09/01/2015 2303   ALBUMIN 4.5 09/01/2015 2303     Time In:  11:45 Time Out: 12:45 Time Total: 60 Greater than 50%  of this time was spent counseling and coordinating care related to the above assessment and plan.  Signed by: Hilbert OdorGOLDING,Manly Nestle, DO  Kelseigh Diver L Neldon Shepard, DO  09/05/2015, 12:50 PM  Please contact Palliative Medicine Team phone at [phone] for questions and concerns.

## 2015-09-05 NOTE — Discharge Summary (Signed)
Physician Discharge Summary  Raymond Ferrell VHQ:[id number] DOB: 02-15-1941 DOA: 09/01/2015  PCP: Darrow Bussing, MD  Admit date: 09/01/2015 Discharge date: 09/05/2015  Time spent: 25 minutes  Recommendations for Outpatient Follow-up:  Discharge to residential hospice.  Discharge Diagnoses:  Principal Problem:   SDH (subdural hematoma) (HCC)   Active Problems:   Acute encephalopathy   Speech problem   Abnormality of gait   Fever   Essential hypertension   Diabetes mellitus without complication (HCC)   Parkinson's disease, Lewy body (HCC)   Dementia   Discharge Condition: poor   Diet recommendation: NPO  Filed Weights   09/02/15 0600 09/03/15 0400  Weight: 72.1 kg (158 lb 15.2 oz) 74.4 kg (164 lb 0.4 oz)    History of present illness:  74 year old male with Parkinson's dementia with slurred speech and gait instability, hypertension, hyperlipidemia, diabetes mellitus, anxiety, depression presented with altered mental status and fever. Patient is a resident of [facility] assisted [facility] and on the day of admission he was poorly responsive on waking up and very confused. In the ED patient had hypertensive urgency with blood pressure of 221/183 mmHg, fever 100.40F with normal blood work. Chest x-ray was unremarkable. CT head showed a large left subdural hematoma with left-to-right midline shift with moderate mass effect. Neurosurgeon was consulted who recommended patient was a poor candidate for surgery and conservative management. Patient admitted to hospitalist service.  Hospital Course:  Large subdural hematoma with acute encephalopathy Has left-to-right midline shift with moderate mass effect. Neurosurgery consulted from the ED and recommend patient is a poor candidate for surgery.  - conservative management. Repeat head CT done showing unchanged large subdural hematoma. Discussed extensively with patient's daughter and she agrees with keeping him comfortable and no  aggressive measures. -Patient having waxing and waning mental status but declining overall.  Marland Kitchen appreciate palliative care evaluation  and  recommendation. Plan on discharging to residential hospice.    Fever Unclear etiology. Possibly associated with subdural hematoma. UA and chest x-ray unremarkable. Flu PCR negative. cultures negative.   Uncontrolled blood pressure received  when necessary IV hydralazine.  Type 2 diabetes mellitus Takes metformin at home. Discontinued CBG monitoring and sliding skill coverage.   Depression and anxiety Held home medications. Getting when necessary Ativan  Parkinson's dementia Patient off medications.     Code Status: DO NOT RESUSCITATE, guarded prognosis Family Communication: Spoke with daughter  disposition Plan:D/C to residential hospice     Procedures:  Head CT  Antibiotics:  IV vancomycin and Zosyn 1  HPI/Subjective:  Consultations:  Palliative care    Discharge Exam: Filed Vitals:   09/04/15 2131 09/05/15 0526  BP: 149/73 181/84  Pulse: 97 93  Temp: 98.4 F (36.9 C) 97.4 F (36.3 C)  Resp: 16 16     General: Elderly male lying in bed , poorly arousal today  HEENT: dry oral mucosa  Chest: Clear to auscultation bilaterally  CVS: S1 and S2, no murmurs rub or gallop  GI: Soft, nondistended, nontender, bowel sounds present  Musculoskeletal:: Warm, no edema, moving all extremities  CNS: poorly arousable.   Discharge Instructions    Current Discharge Medication List    START taking these medications   Details  glycopyrrolate (ROBINUL) 1 MG tablet Take 1 tablet (1 mg total) by mouth every 4 (four) hours as needed (excessive secretions). Qty: 15 tablet, Refills: 0    LORazepam (ATIVAN) 2 MG/ML injection Inject 0.25 mLs (0.5 mg total) into the vein every 2 (two) hours as needed (  agitation). Qty: 1 mL, Refills: 0    morphine 2 MG/ML injection Inject 0.5 mLs (1 mg total) into the vein every 2 (two)  hours as needed (pain and shortness of breath. comfort). Qty: 1 mL, Refills: 0      STOP taking these medications     acetaminophen (TYLENOL) 500 MG tablet      LORazepam (ATIVAN) 0.5 MG tablet      mirtazapine (REMERON) 15 MG tablet      QUEtiapine (SEROQUEL) 50 MG tablet        No Known Allergies Follow-up Information    Please follow up.   Why:  at residential  hospice       The results of significant diagnostics from this hospitalization (including imaging, microbiology, ancillary and laboratory) are listed below for reference.    Significant Diagnostic Studies: Ct Head Wo Contrast  09/02/2015  CLINICAL DATA:  Followup subdural hematoma. EXAM: CT HEAD WITHOUT CONTRAST TECHNIQUE: Contiguous axial images were obtained from the base of the skull through the vertex without intravenous contrast. COMPARISON:  Earlier CT scan, same date. FINDINGS: Stable fairly extensive left-sided subdural hematoma coming up over the left convexity. Stable sub false Ing herniation estimated at 6 mm. Stable compression of the left parietal sulci. The gray-white differentiation is maintained. No findings for downward transtentorial herniation. The CSF spaces are maintained. IMPRESSION: Stable fairly extensive left-sided subdural hematoma. No new or progressive findings are identified. Electronically Signed   By: Rudie Meyer M.D.   On: 09/02/2015 17:23   Ct Head Wo Contrast  09/02/2015  CLINICAL DATA:  Altered mental status EXAM: CT HEAD WITHOUT CONTRAST TECHNIQUE: Contiguous axial images were obtained from the base of the skull through the vertex without intravenous contrast. COMPARISON:  07/17/2015 FINDINGS: There is a large left subdural hematoma, covering essentially the entire left cerebral hemisphere. Depth is approximately 7 mm. The subdural continues on to the left tentorium. There is 6 mm left-to-right midline shift. There is moderate effacement of the left lateral ventricle. There is marked  sulcal effacement in the left cerebral hemisphere. There is moderate generalized atrophy. Basal cisterns remain patent. There is marked hemispheric white matter hypodensity suggesting chronic small vessel ischemic disease. No intraventricular or intraparenchymal hemorrhage is evident. No bony abnormality is evident. IMPRESSION: Large left subdural hematoma with left-to-right midline shift and with moderate mass effect. Basal cisterns remain patent. Critical Value/emergent results were called by telephone at the time of interpretation on 09/02/2015 at 3:36 am to Dr. Glendora Score, who verbally acknowledged these results. Electronically Signed   By: Ellery Plunk M.D.   On: 09/02/2015 03:40   Dg Chest Port 1 View  09/01/2015  CLINICAL DATA:  Altered mental status. EXAM: PORTABLE CHEST 1 VIEW COMPARISON:  None. FINDINGS: A single AP portable view of the chest demonstrates no focal airspace consolidation or alveolar edema. The lungs are grossly clear. There is no large effusion or pneumothorax. Cardiac and mediastinal contours appear unremarkable. IMPRESSION: No active disease. Electronically Signed   By: Ellery Plunk M.D.   On: 09/01/2015 23:16    Microbiology: Recent Results (from the past 240 hour(s))  Blood Culture (routine x 2)     Status: None (Preliminary result)   Collection Time: 09/01/15 11:00 PM  Result Value Ref Range Status   Specimen Description BLOOD RIGHT FOREARM  Final   Special Requests BOTTLES DRAWN AEROBIC AND ANAEROBIC 5CC  Final   Culture   Final    NO GROWTH 3 DAYS [facility]    Report Status PENDING  Incomplete  Blood Culture (routine x 2)     Status: None (Preliminary result)   Collection Time: 09/01/15 11:03 PM  Result Value Ref Range Status   Specimen Description BLOOD LEFT ARM  Final   Special Requests BOTTLES DRAWN AEROBIC AND ANAEROBIC 5CC  Final   Culture   Final    NO GROWTH 3 DAYS [facility]formed at [facility]    Report Status  PENDING  Incomplete  Urine culture     Status: None   Collection Time: 09/02/15  1:03 AM  Result Value Ref Range Status   Specimen Description URINE, CATHETERIZED  Final   Special Requests NONE  Final   Culture   Final    NO GROWTH 1 DAY [facility]formed at [facility]    Report Status 09/03/2015 FINAL  Final  MRSA PCR Screening     Status: None   Collection Time: 09/02/15  5:55 AM  Result Value Ref Range Status   MRSA by PCR NEGATIVE NEGATIVE Final    Comment:        The GeneXpert MRSA Assay (FDA approved for NASAL specimens only), is one component of a comprehensive MRSA colonization surveillance program. It is not intended to diagnose MRSA infection nor to guide or monitor treatment for MRSA infections.      Labs: Basic Metabolic Panel:  Recent Labs Lab 09/01/15 2303 09/02/15 0720  NA 138 139  K 4.2 3.8  CL 103 106  CO2 25 24  GLUCOSE 143* 134*  BUN 19 17  CREATININE 1.12 1.02  CALCIUM 9.5 9.0   Liver Function Tests:  Recent Labs Lab 09/01/15 2303  AST 19  ALT 25  ALKPHOS 109  BILITOT 1.1  PROT 7.5  ALBUMIN 4.5   No results for input(s): LIPASE, AMYLASE in the last 168 hours. No results for input(s): AMMONIA in the last 168 hours. CBC:  Recent Labs Lab 09/01/15 2303 09/02/15 0720  WBC 9.7 8.2  NEUTROABS 7.7  --   HGB 12.1* 10.8*  HCT 36.3* 32.2*  MCV 90.5 92.0  PLT 347 299   Cardiac Enzymes: No results for input(s): CKTOTAL, CKMB, CKMBINDEX, TROPONINI in the last 168 hours. BNP: BNP (last 3 results) No results for input(s): BNP in the last 8760 hours.  ProBNP (last 3 results) No results for input(s): PROBNP in the last 8760 hours.  CBG:  Recent Labs Lab 09/02/15 0737 09/02/15 1141 09/02/15 1651 09/02/15 2328 09/03/15 0805  GLUCAP 134* 112* 119* 143* 125*       Signed:  Yeily Link  [facility] 09/05/2015, 1:10 PM

## 2015-09-05 NOTE — Care Management Important Message (Signed)
Important Message  Patient Details  Name: Raymond Ferrell Benish MRN: [id number] Date of Birth: October 22, 1940   Medicare Important Message Given:  Yes    Haskell FlirtJamison, Cricket Goodlin 09/05/2015, 12:53 PMImportant Message  Patient Details  Name: Raymond Ferrell Hieronymus MRN: [id number] Date of Birth: October 22, 1940   Medicare Important Message Given:  Yes    Haskell FlirtJamison, Weldon Nouri 09/05/2015, 12:53 PM

## 2015-09-05 NOTE — Progress Notes (Signed)
CSW spoke with [facility] on today. Per Carley HammedEva, patient will need to be evaluated and then she will follow up with CSW regarding availability. [facility]. CSW will continue to follow and provide support while in hospital.   Fernande BoydenJoyce Eyonna Sandstrom, [facility] Clinical Social Worker [facility] [phone]

## 2015-09-05 NOTE — Progress Notes (Signed)
CSW was informed by Facility Representative Carley Hammedva that bed will be available for patient on tomorrow. CSW to inform MD. CSW will continue to follow and provide support while in hospital.   Fernande BoydenJoyce Jamilynn Whitacre, [facility] Clinical Social Worker [facility] [phone]

## 2015-09-06 DIAGNOSIS — F0390 Unspecified dementia without behavioral disturbance: Secondary | ICD-10-CM

## 2015-09-06 DIAGNOSIS — I6200 Nontraumatic subdural hemorrhage, unspecified: Secondary | ICD-10-CM

## 2015-09-06 DIAGNOSIS — G9340 Encephalopathy, unspecified: Secondary | ICD-10-CM

## 2015-09-06 NOTE — Discharge Summary (Signed)
Physician Discharge Summary  Raymond Ferrell ZOX:[id number] DOB: 1941/09/12 DOA: 09/01/2015  PCP: Darrow BussingKOIRALA,DIBAS, MD  Admit date: 09/01/2015 Discharge date: 09/06/2015   Recommendations for Outpatient Follow-up:  Discharge to residential hospice.  Discharge Diagnoses:  Principal Problem:   SDH (subdural hematoma) (HCC) Active Problems:   Acute encephalopathy   Speech problem   Abnormality of gait   Fever   Essential hypertension   Diabetes mellitus without complication (HCC)   Parkinson's disease, Lewy body (HCC)   Dementia   Discharge Condition: poor   Diet recommendation: NPO  Filed Weights   09/02/15 0600 09/03/15 0400  Weight: 72.1 kg (158 lb 15.2 oz) 74.4 kg (164 lb 0.4 oz)    History of present illness:  Per Dr. Barnett Abuhungle "74 year old male with Parkinson's dementia with slurred speech and gait instability, hypertension, hyperlipidemia, diabetes mellitus, anxiety, depression presented with altered mental status and fever. Patient is a resident of [facility] assisted living and on the day of admission he was poorly responsive on waking up and very confused. In the ED patient had hypertensive urgency with blood pressure of 221/183 mmHg, fever 100.74F with normal blood work. Chest x-ray was unremarkable. CT head showed a large left subdural hematoma with left-to-right midline shift with moderate mass effect. Neurosurgeon was consulted who recommended patient was a poor candidate for surgery and conservative management. Patient admitted to hospitalist service."  Hospital Course:  Large subdural hematoma with acute encephalopathy - Pt has left-to-right midline shift with moderate mass effect.  - Neurosurgery consulted and recommend patient is a poor candidate for surgery. Recommendation for conservative management. - Repeat head CT done demonstrated unchanged large subdural hematoma. Dr. Gonzella Lexhungel discussed with patient's daughter and she agreed with comfort care - PCT has seen  the pt in consultation   Fever - Unclear etiology. - Likely associated with subdural hematoma. UA and chest x-ray unremarkable. Flu PCR negative.  Uncontrolled blood pressure - Acceptable BP, 142/98  Type 2 diabetes mellitus - Comfort care, stopped SSI and metformin   Depression and anxiety - Getting when necessary Ativan  Parkinson's dementia - No further meds - Comfort care, minimize pill burden    Code Status: DNR/DNI   Antibiotics:  IV vancomycin and Zosyn 1  HPI/Subjective:  Consultations:  Palliative care  Discharge Exam: Filed Vitals:   09/05/15 2259 09/06/15 0553  BP: 177/87 142/98  Pulse: 80 92  Temp: 97.4 F (36.3 C) 98.1 F (36.7 C)  Resp: 20 20     General: no distress  HEENT: dry oral mucosa  Chest: bilateral air entry  Discharge Instructions   Discharge Instructions    Diet - low sodium heart healthy    Complete by:  As directed      Increase activity slowly    Complete by:  As directed           Current Discharge Medication List    START taking these medications   Details  glycopyrrolate (ROBINUL) 1 MG tablet Take 1 tablet (1 mg total) by mouth every 4 (four) hours as needed (excessive secretions). Qty: 15 tablet, Refills: 0    LORazepam (ATIVAN) 2 MG/ML injection Inject 0.25 mLs (0.5 mg total) into the vein every 2 (two) hours as needed (agitation). Qty: 1 mL, Refills: 0    morphine 2 MG/ML injection Inject 0.5 mLs (1 mg total) into the vein every 2 (two) hours as needed (pain and shortness of breath. comfort). Qty: 1 mL, Refills: 0      STOP taking  these medications     acetaminophen (TYLENOL) 500 MG tablet      LORazepam (ATIVAN) 0.5 MG tablet      mirtazapine (REMERON) 15 MG tablet      QUEtiapine (SEROQUEL) 50 MG tablet        No Known Allergies Follow-up Information    Please follow up.   Why:  at residential  hospice       The results of significant diagnostics from this hospitalization (including  imaging, microbiology, ancillary and laboratory) are listed below for reference.    Significant Diagnostic Studies: Ct Head Wo Contrast  09/02/2015  CLINICAL DATA:  Followup subdural hematoma. EXAM: CT HEAD WITHOUT CONTRAST TECHNIQUE: Contiguous axial images were obtained from the base of the skull through the vertex without intravenous contrast. COMPARISON:  Earlier CT scan, same date. FINDINGS: Stable fairly extensive left-sided subdural hematoma coming up over the left convexity. Stable sub false Ing herniation estimated at 6 mm. Stable compression of the left parietal sulci. The gray-white differentiation is maintained. No findings for downward transtentorial herniation. The CSF spaces are maintained. IMPRESSION: Stable fairly extensive left-sided subdural hematoma. No new or progressive findings are identified. Electronically Signed   By: Rudie Meyer M.D.   On: 09/02/2015 17:23   Ct Head Wo Contrast  09/02/2015  CLINICAL DATA:  Altered mental status EXAM: CT HEAD WITHOUT CONTRAST TECHNIQUE: Contiguous axial images were obtained from the base of the skull through the vertex without intravenous contrast. COMPARISON:  07/17/2015 FINDINGS: There is a large left subdural hematoma, covering essentially the entire left cerebral hemisphere. Depth is approximately 7 mm. The subdural continues on to the left tentorium. There is 6 mm left-to-right midline shift. There is moderate effacement of the left lateral ventricle. There is marked sulcal effacement in the left cerebral hemisphere. There is moderate generalized atrophy. Basal cisterns remain patent. There is marked hemispheric white matter hypodensity suggesting chronic small vessel ischemic disease. No intraventricular or intraparenchymal hemorrhage is evident. No bony abnormality is evident. IMPRESSION: Large left subdural hematoma with left-to-right midline shift and with moderate mass effect. Basal cisterns remain patent. Critical Value/emergent results  were called by telephone at the time of interpretation on 09/02/2015 at 3:36 am to Dr. Glendora Score, who verbally acknowledged these results. Electronically Signed   By: Ellery Plunk M.D.   On: 09/02/2015 03:40   Dg Chest Port 1 View  09/01/2015  CLINICAL DATA:  Altered mental status. EXAM: PORTABLE CHEST 1 VIEW COMPARISON:  None. FINDINGS: A single AP portable view of the chest demonstrates no focal airspace consolidation or alveolar edema. The lungs are grossly clear. There is no large effusion or pneumothorax. Cardiac and mediastinal contours appear unremarkable. IMPRESSION: No active disease. Electronically Signed   By: Ellery Plunk M.D.   On: 09/01/2015 23:16    Microbiology: Recent Results (from the past 240 hour(s))  Blood Culture (routine x 2)     Status: None (Preliminary result)   Collection Time: 09/01/15 11:00 PM  Result Value Ref Range Status   Specimen Description BLOOD RIGHT FOREARM  Final   Special Requests BOTTLES DRAWN AEROBIC AND ANAEROBIC 5CC  Final   Culture   Final    NO GROWTH 3 DAYS Performed at [facility]    Report Status PENDING  Incomplete  Blood Culture (routine x 2)     Status: None (Preliminary result)   Collection Time: 09/01/15 11:03 PM  Result Value Ref Range Status   Specimen Description BLOOD LEFT ARM  Final   Special Requests BOTTLES DRAWN AEROBIC AND ANAEROBIC 5CC  Final   Culture   Final    NO GROWTH 3 DAYS Performed at [facility]    Report Status PENDING  Incomplete  Urine culture     Status: None   Collection Time: 09/02/15  1:03 AM  Result Value Ref Range Status   Specimen Description URINE, CATHETERIZED  Final   Special Requests NONE  Final   Culture   Final    NO GROWTH 1 DAY Performed at [facility]    Report Status 09/03/2015 FINAL  Final  MRSA PCR Screening     Status: None   Collection Time: 09/02/15  5:55 AM  Result Value Ref Range Status   MRSA by PCR NEGATIVE NEGATIVE Final    Comment:         The GeneXpert MRSA Assay (FDA approved for NASAL specimens only), is one component of a comprehensive MRSA colonization surveillance program. It is not intended to diagnose MRSA infection nor to guide or monitor treatment for MRSA infections.      Labs: Basic Metabolic Panel:  Recent Labs Lab 09/01/15 2303 09/02/15 0720  NA 138 139  K 4.2 3.8  CL 103 106  CO2 25 24  GLUCOSE 143* 134*  BUN 19 17  CREATININE 1.12 1.02  CALCIUM 9.5 9.0   Liver Function Tests:  Recent Labs Lab 09/01/15 2303  AST 19  ALT 25  ALKPHOS 109  BILITOT 1.1  PROT 7.5  ALBUMIN 4.5   No results for input(s): LIPASE, AMYLASE in the last 168 hours. No results for input(s): AMMONIA in the last 168 hours. CBC:  Recent Labs Lab 09/01/15 2303 09/02/15 0720  WBC 9.7 8.2  NEUTROABS 7.7  --   HGB 12.1* 10.8*  HCT 36.3* 32.2*  MCV 90.5 92.0  PLT 347 299   Cardiac Enzymes: No results for input(s): CKTOTAL, CKMB, CKMBINDEX, TROPONINI in the last 168 hours. BNP: BNP (last 3 results) No results for input(s): BNP in the last 8760 hours.  ProBNP (last 3 results) No results for input(s): PROBNP in the last 8760 hours.  CBG:  Recent Labs Lab 09/02/15 0737 09/02/15 1141 09/02/15 1651 09/02/15 2328 09/03/15 0805  GLUCAP 134* 112* 119* 143* 125*       Signed:  Misael Mcgaha  [facility] 09/06/2015, 10:46 AM

## 2015-09-06 NOTE — Progress Notes (Addendum)
[facility].  [facility] has arrived Packet for transport given.  Pt VSS IV removed. Pt appears comfortable.

## 2015-09-06 NOTE — Consult Note (Signed)
[facility] Liaison: [facility] room available for Mr. Daisy this morning. Met with patient's daughter Helene Kelp yesterday afternoon to complete registration paper work. Dr. Orpah Melter to assume care per Teresa's request.   Please fax updated discharge summary to [phone].  RN please call report to [phone].  Thank you.  Erling Conte, [place]

## 2015-09-06 NOTE — Progress Notes (Signed)
Pt seen and examined at the bedside. Stable for discharge. Please refer to discharge summary completed 09/05/2015.  [facility] [phone]

## 2015-09-06 NOTE — Progress Notes (Signed)
Pt to be d/c today to [facility].   Pt and family agreeable. Confirmed plans with facility.  Plan transfer via EMS.    Valjean Ruppel LCSWA  [facility]  [phone]    

## 2015-09-07 LAB — CULTURE, BLOOD (ROUTINE X 2)
CULTURE: NO GROWTH
CULTURE: NO GROWTH

## 2015-09-11 ENCOUNTER — Ambulatory Visit: Payer: Self-pay | Admitting: Neurology

## 2015-09-30 DEATH — deceased

## 2017-04-25 IMAGING — CT CT HEAD W/O CM
3 of 4 series · 15 of 37 positions shown, 16 images · non-contrast
Comparison: MRI cervical spine August 05, 2011

CLINICAL DATA: Unwitnessed fall, forehead abrasion. History of
diabetes, hypertension, gait abnormality and speech problems.

EXAM:
CT HEAD WITHOUT CONTRAST
CT CERVICAL SPINE WITHOUT CONTRAST
TECHNIQUE: Multidetector CT imaging of the head and cervical spine was
performed following the standard protocol without intravenous
contrast. Multiplanar CT image reconstructions of the cervical spine
were also generated.

[Series 3: head w/o · axial · non-contrast · 0.46mm/px · z∈[-140,-50]mm · 4 of 32 slices shown, 5 images]
[im 7/32  brain]
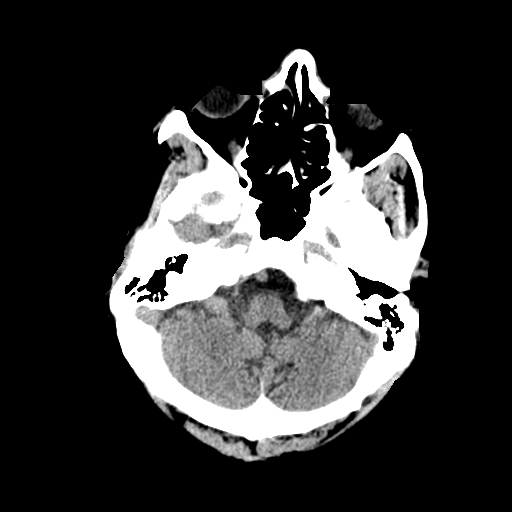
[im 7/32  bone]
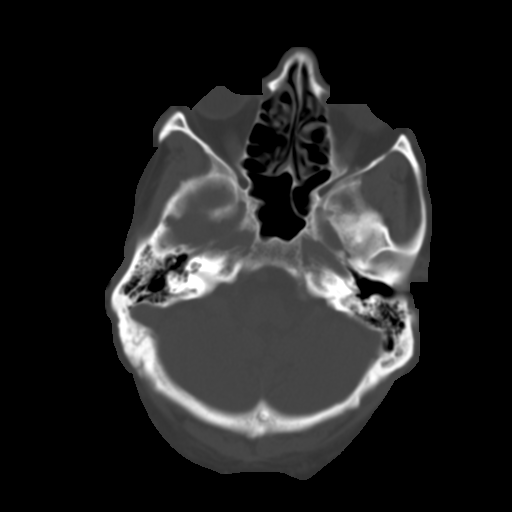
[im 13/32  brain]
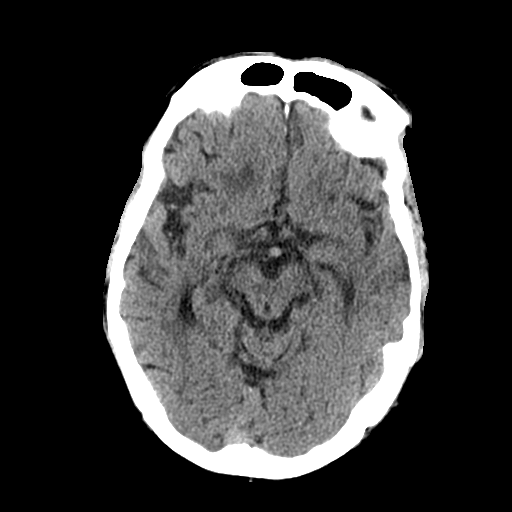
[im 19/32  brain]
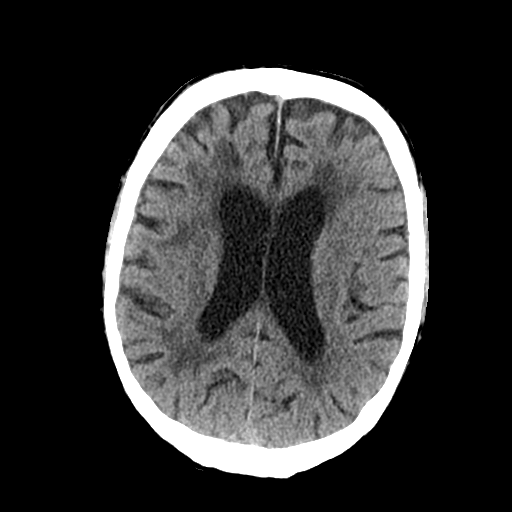
[im 25/32  brain]
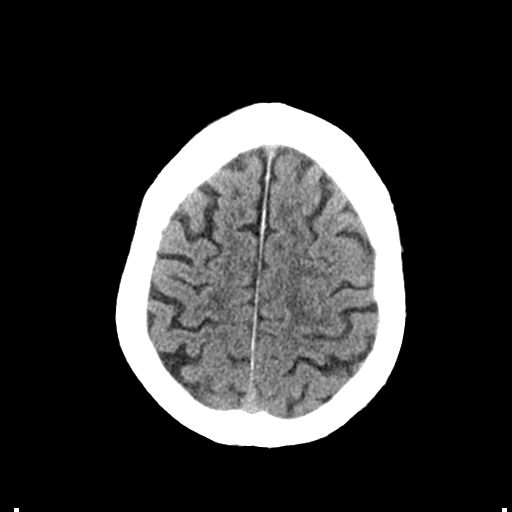

[Series 4: bone windows · axial · 0.46mm/px · z∈[-155,-32]mm · 8 of 53 slices shown]
[im 6/53  bone]
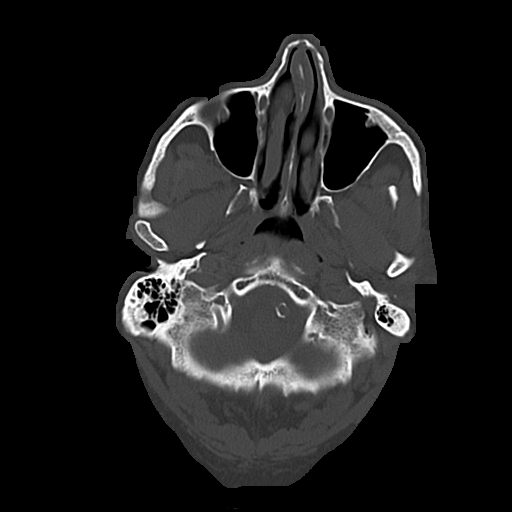
[im 12/53  bone]
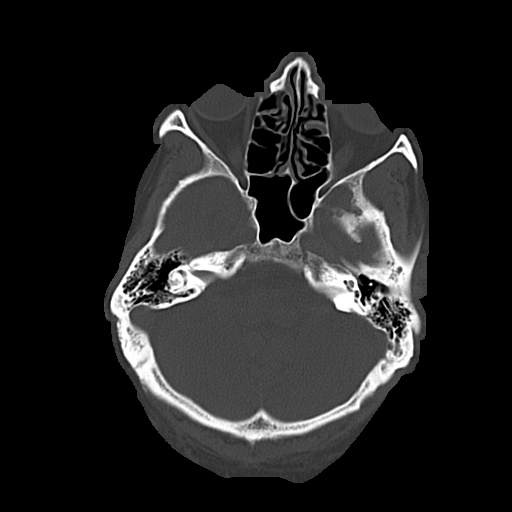
[im 18/53  bone]
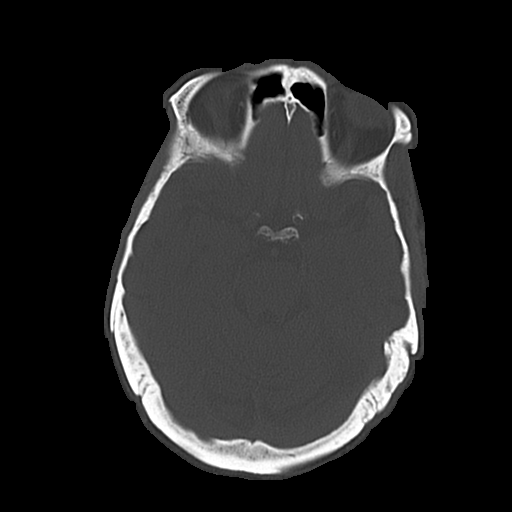
[im 24/53  bone]
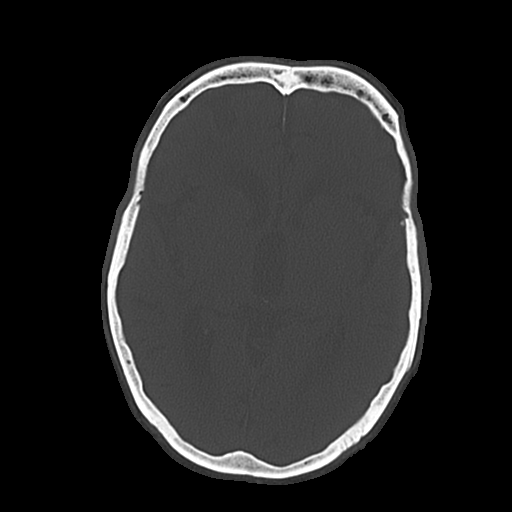
[im 29/53  bone]
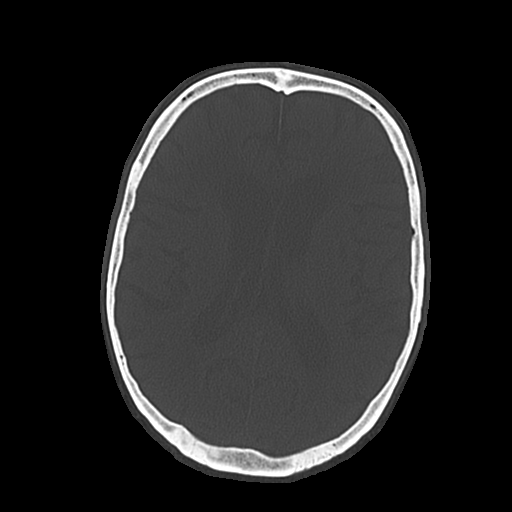
[im 35/53  bone]
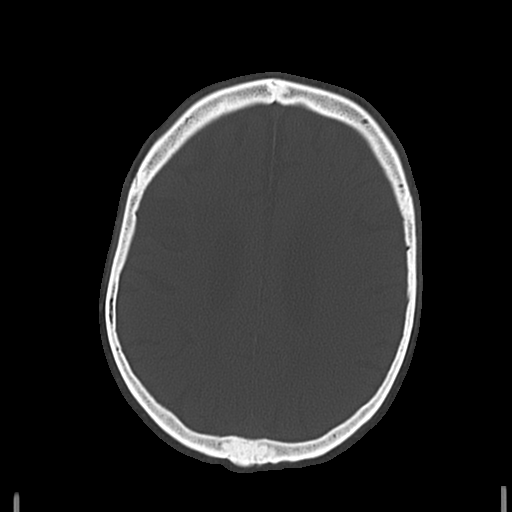
[im 41/53  bone]
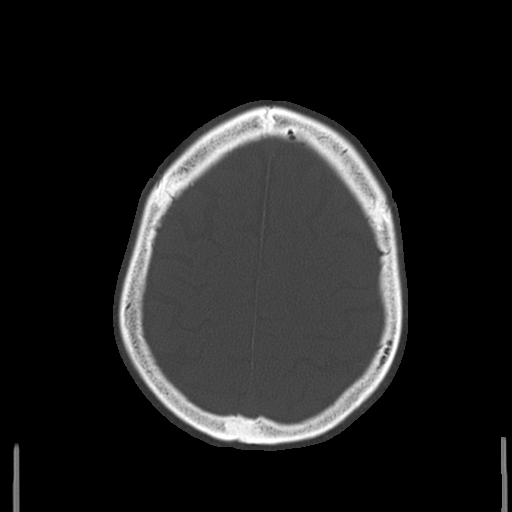
[im 47/53  bone]
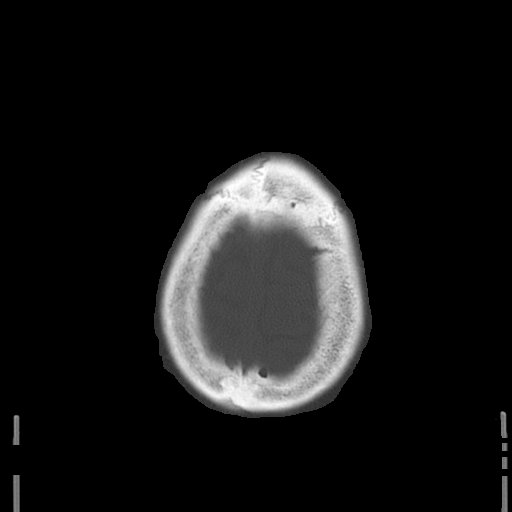

[Series 9: sagittal · sagittal · 0.21mm/px · 3 of 43 slices shown]
[im 15/43  brain]
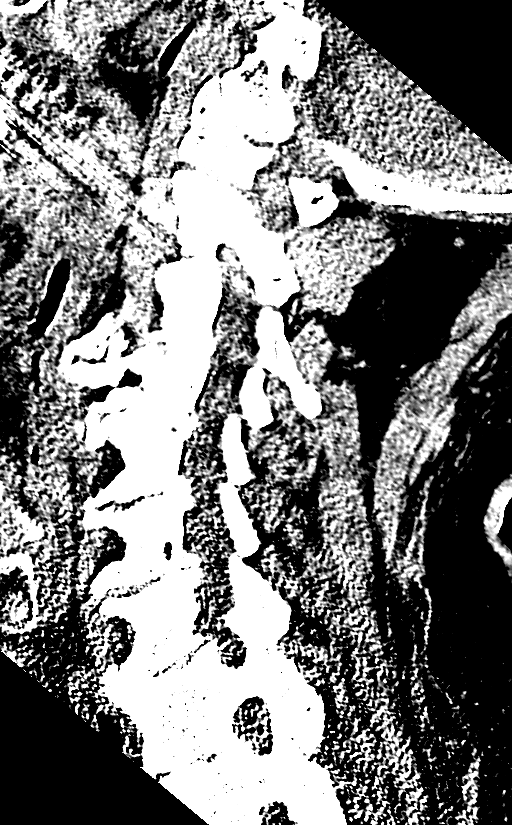
[im 22/43  brain]
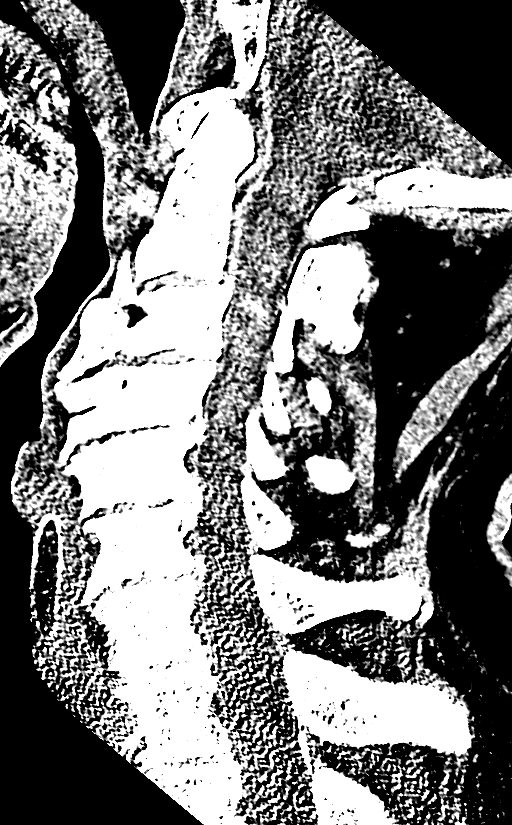
[im 29/43  brain]
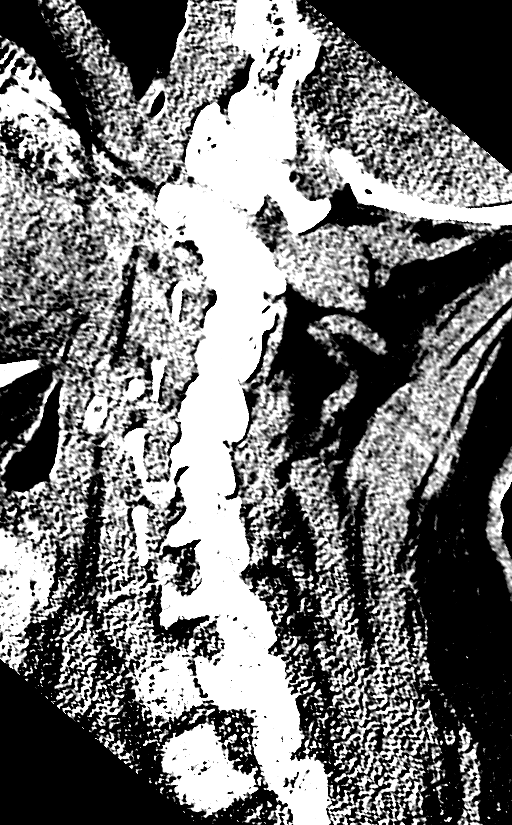

[15 of 37 positions shown; findings below may reference images not displayed]

FINDINGS: CT HEAD FINDINGS

Moderate to severe ventriculomegaly, with overall proportional
enlargement of cerebral sulci and cerebellar folia. No
intraparenchymal hemorrhage, mass effect nor midline shift. Patchy
to confluent supratentorial white matter hypodensities. No acute
large vascular territory infarcts. Bilateral inferior basal ganglia
perivascular spaces.

No abnormal extra-axial fluid collections. Basal cisterns are
patent. Moderate calcific atherosclerosis of the carotid siphons.

Small RIGHT frontal scalp hematoma. No subcutaneous gas or
radiopaque foreign bodies there No skull fracture. The included
ocular globes and orbital contents are non-suspicious. Status post
bilateral ocular lens implants. The mastoid aircells and included
paranasal sinuses are well-aerated. Moderate temporomandibular
osteoarthrosis.

CT CERVICAL SPINE FINDINGS

Cervical vertebral bodies and posterior elements intact. Grade 1
C2-3 anterolisthesis, stable from prior examination. C1-2
articulation maintained with moderate arthropathy. Moderate to
severe C4-5 through C7-T1 disc height loss, endplate sclerosis and
marginal spurring consistent with degenerative disc. Severe LEFT
C2-3 facet arthropathy. Severe LEFT C3-4, bilateral C4-5, bilateral
C5-6 and RIGHT C6-7 neural foraminal narrowing. Bulky ventral
endplate spurring can be seen in the setting of DISH. No destructive
bony lesions. Severe RIGHT, moderate LEFT carotid bifurcation
calcific atherosclerosis.
IMPRESSION: CT HEAD: Small RIGHT frontal scalp hematoma.  No skull fracture.

No acute intracranial process.

Moderate to severe global brain atrophy, advanced for age. Moderate
to severe white matter changes compatible chronic small vessel
ischemic disease, advanced for age.

CT CERVICAL SPINE: No acute cervical spine fracture. Stable grade 1
C2-3 anterolisthesis on degenerative basis.

Multilevel severe neural foraminal narrowing.
